# Patient Record
Sex: Female | Born: 1945 | Race: White | Hispanic: No | Marital: Single
Health system: Midwestern US, Academic
[De-identification: ages and names within clinical notes are randomized; demographics above are authoritative.]

---

## 2016-07-08 MED ORDER — METOPROLOL SUCCINATE 50 MG PO TB24
75 mg | ORAL_TABLET | Freq: Every day | ORAL | 3 refills | 90.00000 days | Status: DC
Start: 2016-07-08 — End: 2017-11-19

## 2016-10-09 ENCOUNTER — Encounter: Admit: 2016-10-09 | Discharge: 2016-10-09 | Payer: MEDICARE

## 2016-10-09 ENCOUNTER — Ambulatory Visit: Admit: 2016-10-09 | Discharge: 2016-10-10 | Payer: MEDICARE

## 2016-10-09 DIAGNOSIS — Z95810 Presence of automatic (implantable) cardiac defibrillator: ICD-10-CM

## 2016-10-09 DIAGNOSIS — I428 Other cardiomyopathies: ICD-10-CM

## 2016-10-09 DIAGNOSIS — I4581 Long QT syndrome: Secondary | ICD-10-CM

## 2016-10-10 DIAGNOSIS — Z95810 Presence of automatic (implantable) cardiac defibrillator: ICD-10-CM

## 2016-10-10 DIAGNOSIS — I471 Supraventricular tachycardia: ICD-10-CM

## 2016-10-10 DIAGNOSIS — I4901 Ventricular fibrillation: Principal | ICD-10-CM

## 2017-02-16 ENCOUNTER — Encounter: Admit: 2017-02-16 | Discharge: 2017-02-16 | Payer: MEDICARE

## 2017-02-16 ENCOUNTER — Ambulatory Visit: Admit: 2017-02-16 | Discharge: 2017-02-17 | Payer: MEDICARE

## 2017-02-17 DIAGNOSIS — I471 Supraventricular tachycardia: ICD-10-CM

## 2017-02-17 DIAGNOSIS — I4581 Long QT syndrome: ICD-10-CM

## 2017-02-17 DIAGNOSIS — Z95810 Presence of automatic (implantable) cardiac defibrillator: ICD-10-CM

## 2017-02-17 DIAGNOSIS — I4901 Ventricular fibrillation: Principal | ICD-10-CM

## 2017-04-16 ENCOUNTER — Ambulatory Visit: Admit: 2017-04-16 | Discharge: 2017-04-17 | Payer: MEDICARE

## 2017-04-16 DIAGNOSIS — Z95810 Presence of automatic (implantable) cardiac defibrillator: Secondary | ICD-10-CM

## 2017-04-17 DIAGNOSIS — I471 Supraventricular tachycardia: Principal | ICD-10-CM

## 2017-04-17 DIAGNOSIS — I4581 Long QT syndrome: ICD-10-CM

## 2017-07-15 ENCOUNTER — Encounter: Admit: 2017-07-15 | Discharge: 2017-07-15 | Payer: MEDICARE

## 2017-07-16 ENCOUNTER — Ambulatory Visit: Admit: 2017-07-16 | Discharge: 2017-07-17 | Payer: MEDICARE

## 2017-07-17 DIAGNOSIS — I4581 Long QT syndrome: ICD-10-CM

## 2017-07-17 DIAGNOSIS — I4901 Ventricular fibrillation: Principal | ICD-10-CM

## 2017-07-17 DIAGNOSIS — Z95810 Presence of automatic (implantable) cardiac defibrillator: ICD-10-CM

## 2017-07-17 DIAGNOSIS — I471 Supraventricular tachycardia: ICD-10-CM

## 2017-07-20 ENCOUNTER — Encounter: Admit: 2017-07-20 | Discharge: 2017-07-20 | Payer: MEDICARE

## 2017-10-15 ENCOUNTER — Ambulatory Visit: Admit: 2017-10-15 | Discharge: 2017-10-16 | Payer: MEDICARE

## 2017-10-15 DIAGNOSIS — I471 Supraventricular tachycardia: Secondary | ICD-10-CM

## 2017-10-16 DIAGNOSIS — Z95810 Presence of automatic (implantable) cardiac defibrillator: ICD-10-CM

## 2017-10-16 DIAGNOSIS — I4581 Long QT syndrome: ICD-10-CM

## 2017-10-16 DIAGNOSIS — I4901 Ventricular fibrillation: Principal | ICD-10-CM

## 2017-11-19 ENCOUNTER — Ambulatory Visit: Admit: 2017-11-19 | Discharge: 2017-11-19 | Payer: MEDICARE

## 2017-11-19 ENCOUNTER — Encounter: Admit: 2017-11-19 | Discharge: 2017-11-19 | Payer: MEDICARE

## 2017-11-19 DIAGNOSIS — I341 Nonrheumatic mitral (valve) prolapse: ICD-10-CM

## 2017-11-19 DIAGNOSIS — Z95810 Presence of automatic (implantable) cardiac defibrillator: ICD-10-CM

## 2017-11-19 DIAGNOSIS — I4891 Unspecified atrial fibrillation: ICD-10-CM

## 2017-11-19 DIAGNOSIS — I480 Paroxysmal atrial fibrillation: ICD-10-CM

## 2017-11-19 DIAGNOSIS — I4581 Long QT syndrome: ICD-10-CM

## 2017-11-19 DIAGNOSIS — R030 Elevated blood-pressure reading, without diagnosis of hypertension: ICD-10-CM

## 2017-11-19 DIAGNOSIS — I469 Cardiac arrest, cause unspecified: ICD-10-CM

## 2017-11-19 DIAGNOSIS — I340 Nonrheumatic mitral (valve) insufficiency: ICD-10-CM

## 2017-11-19 DIAGNOSIS — I4901 Ventricular fibrillation: Principal | ICD-10-CM

## 2017-11-19 DIAGNOSIS — I428 Other cardiomyopathies: ICD-10-CM

## 2017-11-19 MED ORDER — METOPROLOL SUCCINATE 50 MG PO TB24
75 mg | ORAL_TABLET | Freq: Every day | ORAL | 3 refills | Status: SS
Start: 2017-11-19 — End: 2018-08-31

## 2017-11-21 ENCOUNTER — Encounter: Admit: 2017-11-21 | Discharge: 2017-11-21 | Payer: MEDICARE

## 2017-11-21 DIAGNOSIS — I469 Cardiac arrest, cause unspecified: ICD-10-CM

## 2017-11-21 DIAGNOSIS — I340 Nonrheumatic mitral (valve) insufficiency: ICD-10-CM

## 2017-11-21 DIAGNOSIS — I4891 Unspecified atrial fibrillation: ICD-10-CM

## 2017-11-21 DIAGNOSIS — I341 Nonrheumatic mitral (valve) prolapse: ICD-10-CM

## 2017-12-07 ENCOUNTER — Encounter: Admit: 2017-12-07 | Discharge: 2017-12-07 | Payer: MEDICARE

## 2018-01-14 ENCOUNTER — Ambulatory Visit: Admit: 2018-01-14 | Discharge: 2018-01-15 | Payer: MEDICARE

## 2018-01-14 DIAGNOSIS — Z95810 Presence of automatic (implantable) cardiac defibrillator: Secondary | ICD-10-CM

## 2018-01-15 ENCOUNTER — Encounter: Admit: 2018-01-15 | Discharge: 2018-01-15 | Payer: MEDICARE

## 2018-01-15 DIAGNOSIS — I428 Other cardiomyopathies: Principal | ICD-10-CM

## 2018-01-15 DIAGNOSIS — Z95810 Presence of automatic (implantable) cardiac defibrillator: ICD-10-CM

## 2018-01-15 DIAGNOSIS — I472 Ventricular tachycardia: ICD-10-CM

## 2018-01-15 DIAGNOSIS — I480 Paroxysmal atrial fibrillation: ICD-10-CM

## 2018-04-15 ENCOUNTER — Ambulatory Visit: Admit: 2018-04-15 | Discharge: 2018-04-15 | Payer: MEDICARE

## 2018-04-15 DIAGNOSIS — I428 Other cardiomyopathies: Secondary | ICD-10-CM

## 2018-04-15 DIAGNOSIS — I480 Paroxysmal atrial fibrillation: Principal | ICD-10-CM

## 2018-04-15 DIAGNOSIS — Z95810 Presence of automatic (implantable) cardiac defibrillator: ICD-10-CM

## 2018-07-15 ENCOUNTER — Ambulatory Visit: Admit: 2018-07-15 | Discharge: 2018-07-15

## 2018-07-15 DIAGNOSIS — I428 Other cardiomyopathies: Secondary | ICD-10-CM

## 2018-07-15 DIAGNOSIS — Z95810 Presence of automatic (implantable) cardiac defibrillator: Secondary | ICD-10-CM

## 2018-07-15 DIAGNOSIS — I480 Paroxysmal atrial fibrillation: Secondary | ICD-10-CM

## 2018-07-16 ENCOUNTER — Encounter: Admit: 2018-07-16 | Discharge: 2018-07-16

## 2018-07-24 IMAGING — MG MAMMOGRAM 3D SCREEN, BILATERAL
12 of 17 series · 12 of 17 positions shown · non-contrast
Comparison: none

[R CC (1 of 2)]
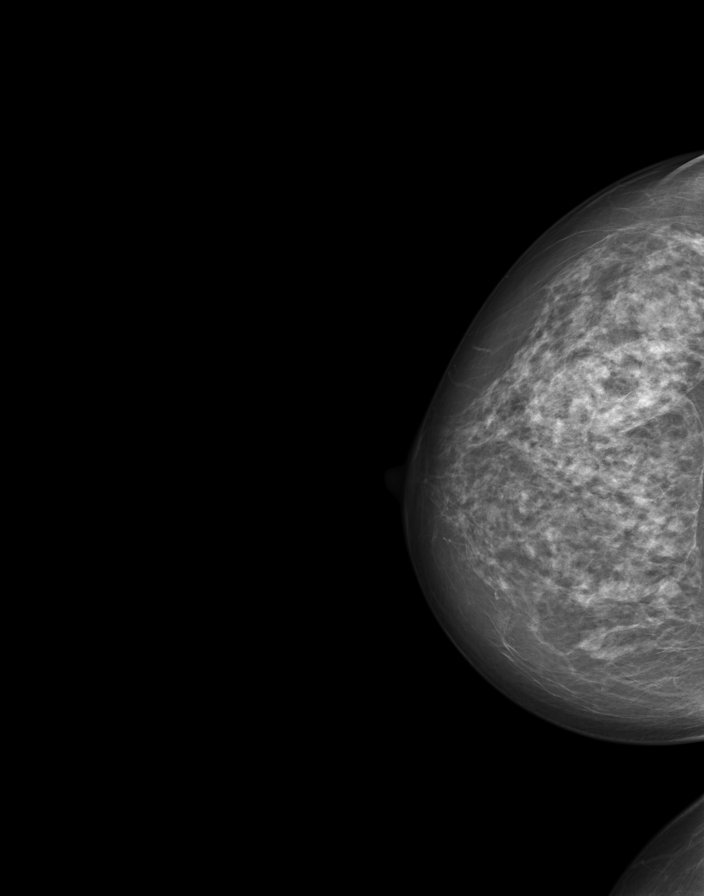

[R tomo (1 of 2)]
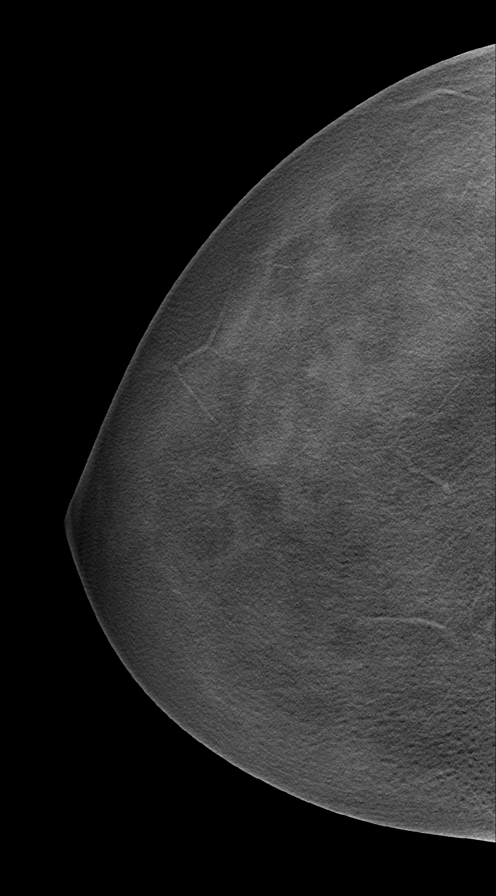

[R CC (2 of 2)]
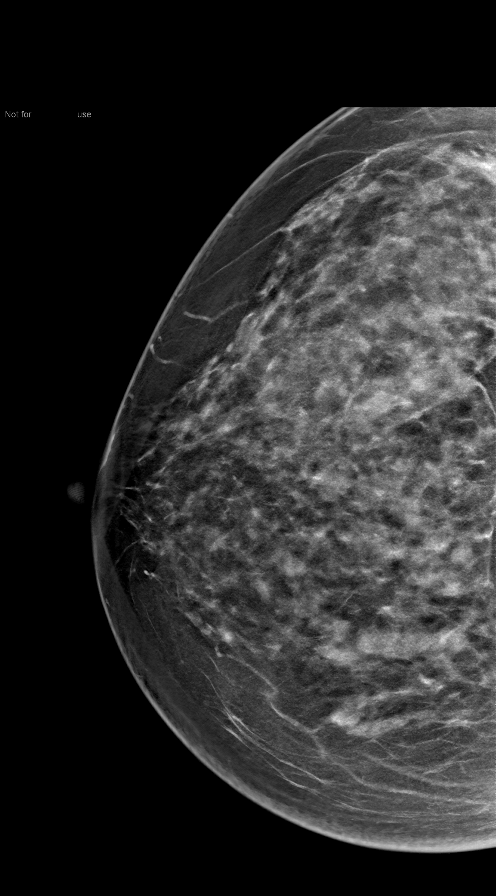

[R (1 of 2)]
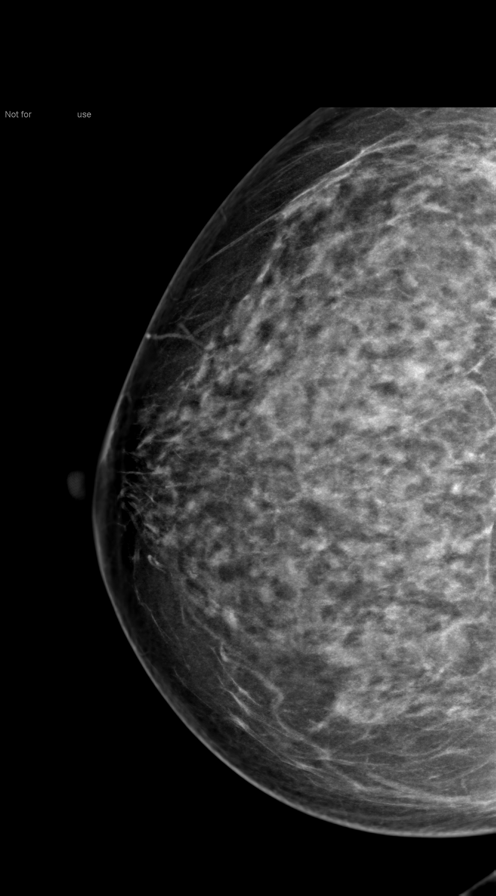

[L CC (1 of 2)]
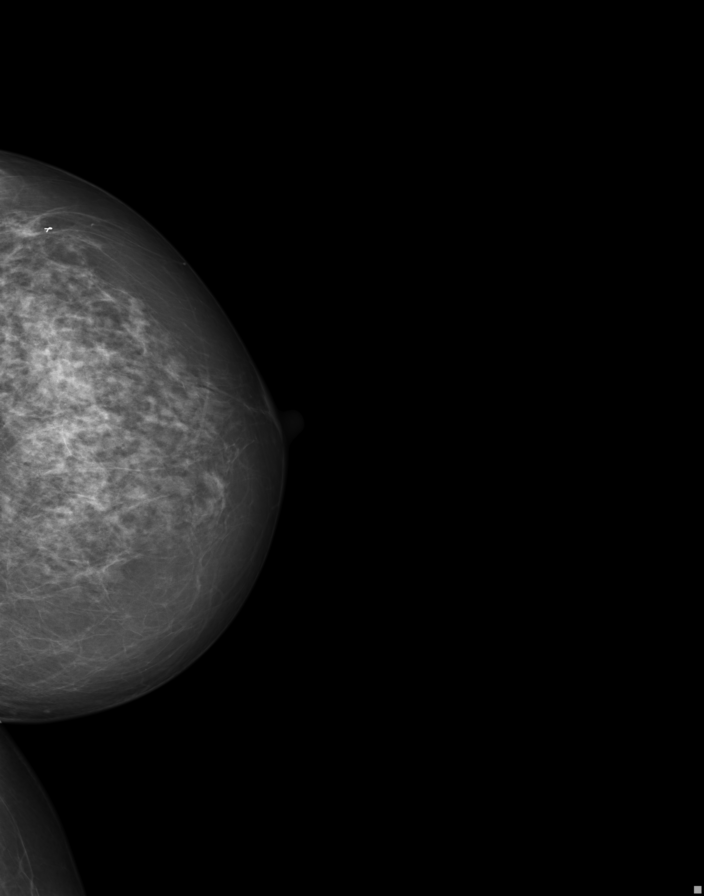

[L tomo]
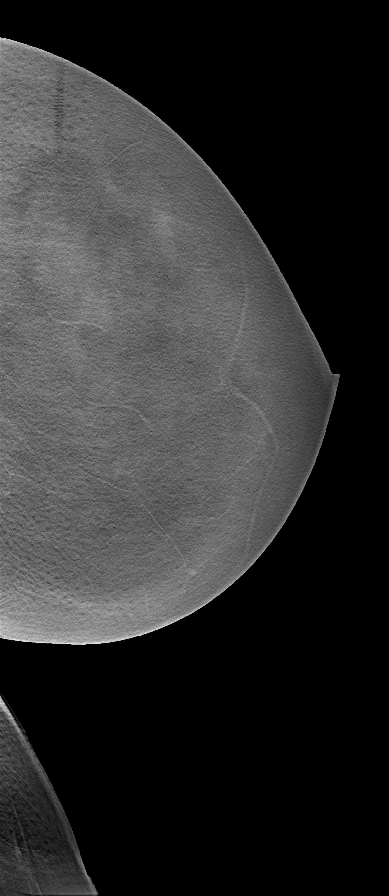

[L CC (2 of 2)]
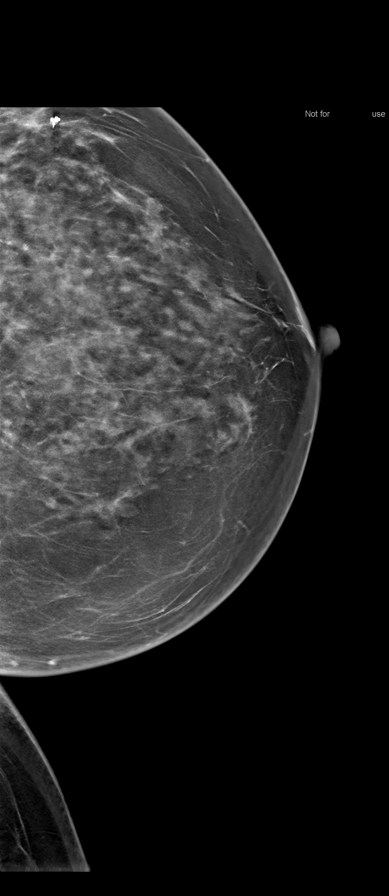

[L]
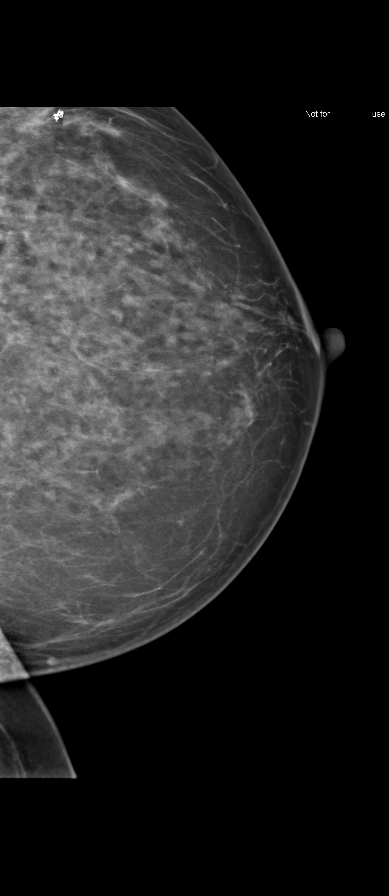

[R MLO (1 of 2)]
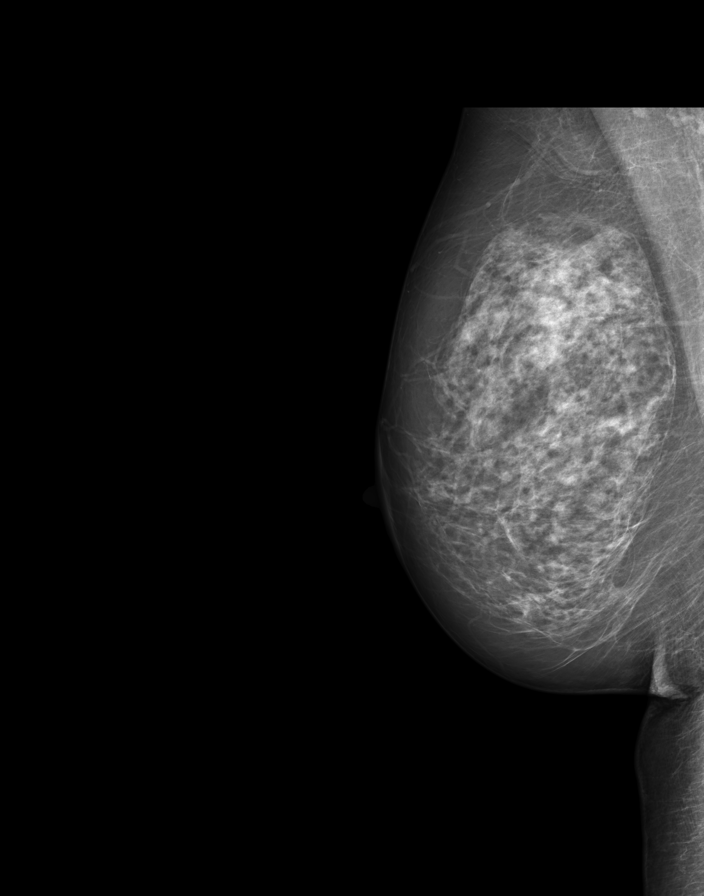

[R tomo (2 of 2)]
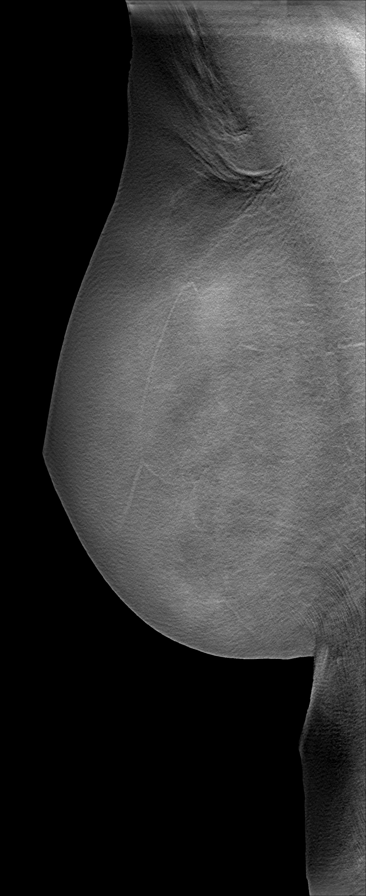

[R MLO (2 of 2)]
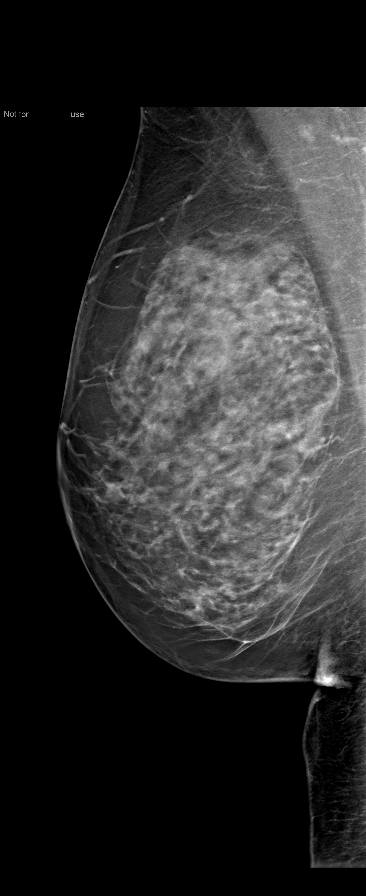

[R (2 of 2)]
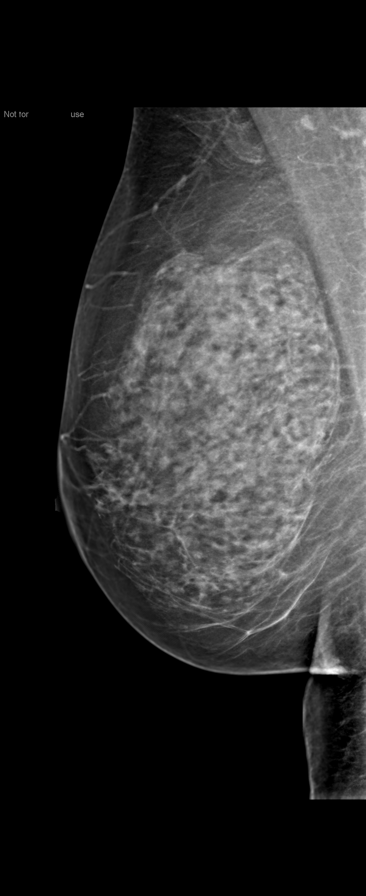

[12 of 17 positions shown; findings below may reference images not displayed]

EXAM

Digital screening mammogram with 3D breast tomography.

INDICATION

SCREENING
HX 2 BENIGN BXs - RT BREAST IN 9444, LT BREAST IN 1088. PACEMAKER.
NULLIPARITY. MOTHER DX @ 85. SCREENING. AB (3D) PRIORS: 4486, 7401.

FINDINGS

The prior study was reviewed from 07/12/2014.

Digital 2D CC and MLO projections obtained with 3D tomographic views per manufacturer's protocol.
ICAD version 7.2 was used during this exam.

There are scattered areas of fibroglandular density bilaterally. There are no mass lesions or
suspicious calcifications.

There is a marker clip from a previous left breast biopsy.

Pacemaker overlies the left axilla.

There has been no significant change from the previous examination.

IMPRESSION

Benign mammogram with 3D breast tomography. BI-RADS 2. Screening mammography in 12 months is
recommended. A followup letter will be scheduled.

## 2018-08-29 ENCOUNTER — Encounter: Admit: 2018-08-29 | Discharge: 2018-08-29

## 2018-08-29 DIAGNOSIS — R7989 Other specified abnormal findings of blood chemistry: Secondary | ICD-10-CM

## 2018-08-29 DIAGNOSIS — I480 Paroxysmal atrial fibrillation: Secondary | ICD-10-CM

## 2018-08-29 DIAGNOSIS — I469 Cardiac arrest, cause unspecified: Secondary | ICD-10-CM

## 2018-08-29 DIAGNOSIS — I4891 Unspecified atrial fibrillation: Secondary | ICD-10-CM

## 2018-08-29 DIAGNOSIS — I341 Nonrheumatic mitral (valve) prolapse: Secondary | ICD-10-CM

## 2018-08-29 DIAGNOSIS — I340 Nonrheumatic mitral (valve) insufficiency: Secondary | ICD-10-CM

## 2018-08-29 DIAGNOSIS — Z4502 Encounter for adjustment and management of automatic implantable cardiac defibrillator: Secondary | ICD-10-CM

## 2018-08-29 LAB — LIPID PROFILE
Lab: 145 mg/dL — ABNORMAL HIGH (ref <100)
Lab: 170 mg/dL
Lab: 175 mg/dL — ABNORMAL HIGH (ref <150)
Lab: 217 mg/dL — ABNORMAL HIGH (ref <200)
Lab: 47 mg/dL (ref >40)

## 2018-08-29 LAB — URINALYSIS DIPSTICK REFLEX TO CULTURE
Lab: 1 mg/dL (ref 1.003–1.035)
Lab: NEGATIVE % (ref 41–77)
Lab: NEGATIVE U/L (ref 7–40)
Lab: NEGATIVE U/L — ABNORMAL LOW (ref 25–110)

## 2018-08-29 LAB — TROPONIN-I
Lab: 0.4 ng/mL — ABNORMAL HIGH (ref 0.0–0.05)
Lab: 0.2 ng/mL — ABNORMAL HIGH (ref 0.0–0.05)

## 2018-08-29 LAB — URINALYSIS MICROSCOPIC REFLEX TO CULTURE

## 2018-08-29 LAB — CBC AND DIFF
Lab: 0 10*3/uL (ref 0–0.20)
Lab: 0.3 10*3/uL (ref 0–0.45)
Lab: 1.5 K/UL — ABNORMAL LOW (ref 1.0–4.8)
Lab: 4.9 M/UL (ref 4.0–5.0)
Lab: 88 FL — ABNORMAL HIGH (ref 80–100)
Lab: 9.1 10*3/uL (ref 4.5–11.0)

## 2018-08-29 LAB — COMPREHENSIVE METABOLIC PANEL
Lab: 0.3 mg/dL (ref 0.3–1.2)
Lab: 107 MMOL/L (ref 98–110)
Lab: 12 K/UL (ref 3–12)
Lab: >60 mL/min (ref >60)

## 2018-08-29 LAB — COVID-19 (SARS-COV-2) PCR

## 2018-08-29 LAB — PTT (APTT): Lab: 32 s (ref 24.0–36.5)

## 2018-08-29 LAB — BNP POC ER: Lab: 274 pg/mL — ABNORMAL HIGH (ref 0–100)

## 2018-08-29 LAB — POC TROPONIN: Lab: 0.1 ng/mL — ABNORMAL HIGH (ref 0.00–0.05)

## 2018-08-29 MED ORDER — SOTALOL 80 MG PO TAB
120 mg | ORAL | 0 refills | Status: DC
Start: 2018-08-29 — End: 2018-08-31
  Administered 2018-08-29 – 2018-08-31 (×6): 120 mg via ORAL

## 2018-08-29 MED ORDER — ASPIRIN 81 MG PO TBEC
81 mg | Freq: Every day | ORAL | 0 refills | Status: DC
Start: 2018-08-29 — End: 2018-08-31
  Administered 2018-08-29 – 2018-08-31 (×3): 81 mg via ORAL

## 2018-08-29 MED ORDER — METOPROLOL SUCCINATE 50 MG PO TB24
100 mg | Freq: Every day | ORAL | 0 refills | Status: DC
Start: 2018-08-29 — End: 2018-08-31
  Administered 2018-08-30 – 2018-08-31 (×2): 100 mg via ORAL

## 2018-08-29 MED ORDER — POTASSIUM CHLORIDE 20 MEQ/15 ML PO LIQD
20-40 meq | ORAL | 0 refills | Status: AC | PRN
Start: 2018-08-29 — End: ?

## 2018-08-29 MED ORDER — POTASSIUM CHLORIDE 20 MEQ PO TBTQ
20-40 meq | ORAL | 0 refills | Status: AC | PRN
Start: 2018-08-29 — End: ?

## 2018-08-29 MED ORDER — APIXABAN 5 MG PO TAB
5 mg | Freq: Two times a day (BID) | ORAL | 0 refills | Status: DC
Start: 2018-08-29 — End: 2018-08-31
  Administered 2018-08-30 – 2018-08-31 (×4): 5 mg via ORAL

## 2018-08-29 MED ORDER — ENOXAPARIN 40 MG/0.4 ML SC SYRG
40 mg | Freq: Every day | SUBCUTANEOUS | 0 refills | Status: DC
Start: 2018-08-29 — End: 2018-08-29
  Administered 2018-08-29: 15:00:00 40 mg via SUBCUTANEOUS

## 2018-08-29 MED ORDER — ASPIRIN 81 MG PO CHEW
243 mg | Freq: Once | ORAL | 0 refills | Status: CP
Start: 2018-08-29 — End: ?
  Administered 2018-08-29: 09:00:00 243 mg via ORAL

## 2018-08-29 MED ORDER — IMS MIXTURE TEMPLATE
75 mg | Freq: Every day | ORAL | 0 refills | Status: DC
Start: 2018-08-29 — End: 2018-08-29
  Administered 2018-08-29 (×2): 75 mg via ORAL

## 2018-08-29 MED ORDER — MULTIVITAMIN PO TAB
1 | Freq: Every day | ORAL | 0 refills | Status: DC
Start: 2018-08-29 — End: 2018-08-31
  Administered 2018-08-29 – 2018-08-31 (×3): 1 via ORAL

## 2018-08-29 MED ORDER — MAGNESIUM SULFATE IN D5W 1 GRAM/100 ML IV PGBK
1 g | INTRAVENOUS | 0 refills | Status: AC | PRN
Start: 2018-08-29 — End: ?

## 2018-08-29 MED ORDER — CETIRIZINE 10 MG PO TAB
10 mg | Freq: Every day | ORAL | 0 refills | Status: DC
Start: 2018-08-29 — End: 2018-08-31
  Administered 2018-08-29 – 2018-08-31 (×3): 10 mg via ORAL

## 2018-08-29 NOTE — H&P (View-Only)
Adult H&P    [facility]  Admission Date:  08/29/2018                     Assessment/Plan:    Principal Problem:    ICD (implantable cardioverter-defibrillator) discharge  Active Problems:    Implantable cardioverter-defibrillator (ICD) discharge    Tammy Heath is a 73 y.o. female  With past medical history of ventricular fibrillation cardiac arrest (08/2005), s/p ICD. 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 ICD interrogation shows atrial fibrillation with RVR with 6 shocks administered.     # Atrial fibrillation with rapid ventricular rate, currently in sinus rhythm  # s/p ICD shocks  - Will continue Metoprolol XL 75 mg dialy  - Patient CHADS VASC score is 4. However, she is not on anticoagulation given her prior bleeding history. Patient reports she does not want anticoagulation.   - EP consultation for ICD reprogramming and possibility of initiating rhythm control medications    # Non ischemic cardiomyopathy  # Fatigue, dyspnea on exertion  # Hyperlipidemia  - Will get an ECHO today  - She had been planned for a nuclear stress test as an outpatient but has not been performed. Patient does report fatigue and shotness of breath on exertion. However, she also reports that she is able to walk more than 4 blocks without exertion. Stress test can be performed as an outpatient.   - Check lipid profile and consider starting statin    # s/p mitral valve replacement    Supportive:  - DVT prophylaxis: Lovenox  - Diet: cardiac diet    Will discuss with Dr. Raynelle Bring.    Lily Lovings  PGY-5 Fellow, Cardiovascular disease  Pager: [phone]    __________________________________________________________________________________    Chief Complaint:  ICD shocks  History of Present Illness: Tammy Heath is a 73 y.o. female  With past medical history of ventricular fibrillation cardiac arrest (08/2005), s/p ICD. 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 Patient reports that for the past 3-4 days, she has had episodes of palpitation which last for a very short time, resolves spontaneoulsy. She had an episode last night before she went to bed. She woke up at 11:30 am and went to the restroom where she had a ICD shock. She subsequently had another shock and she was brought to the hospital. Patient reports that she had 4 more shock, making it 6 in total.    She does not have chest pain on exertion, palpitation, lower extremity swelling, orthopnea, PND. She gets short of breath on climbing about 2 flight of stairs, no recent worsening. No fever, chills, headache, sore throat. No sick contacts, recent travel.     Patient follows up with Dr. Bradly Bienenstock as an outpatient for her atrial fibrillation. She is not on any anticoagulation as she reports not tolerating anticoagulation in the past with development of hemopericardium requiring drainage. She has been on aspirin. She had last visited Dr. Bradly Bienenstock on 11/19/2017 when she had revealed that she was having worsening fatigue and dyspnea on exertion. Nuclear stress test and echocardiogram was planned but has not yet been performed.     Medical History:   Diagnosis Date   ??? Atrial fibrillation (HCC)    ??? Cardiac arrest (HCC)    ??? Cardiac arrest - ventricular fibrillation  8/07    S/p dual chamber defibrillator   ??? Mitral regurgitation 08/04/2008   ??? Mitral valve prolapse 08/04/2008     Surgical History:   Procedure Laterality Date   ??? CARDIAC DEFIBRILLATOR PLACEMENT  8/07     Family History   Problem Relation Age of Onset   ??? Heart Failure Mother    ??? Other Father         COPD   ??? Other Sister         Cardiac valve repair, 3 other sister healthy   ??? Other Brother 2 healthy brothers     Social History     Socioeconomic History   ??? Marital status: Single     Spouse name: Not on file   ??? Number of children: Not on file   ??? Years of education: Not on file   ??? Highest education level: Not on file   Occupational History   ??? 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 Smoking status: Never Smoker   ??? Smokeless tobacco: Never Used   Substance and Sexual Activity   ??? Alcohol use: No   ??? Drug use: No   ??? Sexual activity: Not on file   Other Topics Concern   ??? Not on file   Social History Narrative   ??? Not on file        Allergies:  Adhesive tape-silicones    Medications:  Scheduled Meds:Continuous Infusions:  PRN and Respiratory Meds:       Review of Systems:  All other systems reviewed and are negative.    Physical Exam:  Vital Signs: Last Filed In 24 Hours Vital Signs: 24 Hour Range   BP: 138/78 (07/19 0500)  Temp: 36.9 ???C (98.4 ???F) (07/19 0128)  Pulse: 67 (07/19 0500)  Respirations: 12 PER MINUTE (07/19 0500)  SpO2: 98 % (07/19 0500)  SpO2 Pulse: 67 (07/19 0500) BP: (119-177)/(70-83)   Temp:  [36.9 ???C (98.4 ???F)]   Pulse:  [60-72]   Respirations:  [12 PER MINUTE-24 PER MINUTE]   SpO2:  [98 %-100 %]      No intake or output data in the 24 hours ending 08/29/18 0518     Physical Exam   GENERAL: The patient is well developed, well nourished, resting comfortably and in no distress.   HEENT: No abnormalities of the visible oro-nasopharynx, conjunctiva or sclera are noted.  NECK: There is no jugular venous distension. Carotids are palpable and without bruits. There is no thyroid enlargement.  Chest: Lung fields are clear to auscultation. There are no wheezes or crackles.  CV: There is a regular rhythm. The first and second heart sounds are normal. There are no murmurs, gallops or rubs.  ABD: The abdomen is soft and supple with normal bowel sounds. There is no hepatosplenomegaly, ascites, tenderness, masses or bruits. Neuro: There are no focal motor defects.   Ext: There is no edema or evidence of deep vein thrombosis. Peripheral pulses are satisfactory.    SKIN: There are no rashes and no cellulitis  PSYCH: The patient is calm, rationale and oriented.    Lab/Radiology/Other Diagnostic Tests:  CBC w/Diff    Lab Results   Component Value Date/Time    WBC 9.1 08/29/2018 02:02 AM    RBC 4.97 08/29/2018 02:02 AM    HGB 14.9 08/29/2018 02:02 AM    HCT 43.9 08/29/2018 02:02 AM    MCV 88.4  08/29/2018 02:02 AM    MCH 30.1 08/29/2018 02:02 AM    MCHC 34.0 08/29/2018 02:02 AM    RDW 13.9 08/29/2018 02:02 AM    PLTCT 236 08/29/2018 02:02 AM    MPV 9.2 08/29/2018 02:02 AM    Lab Results   Component Value Date/Time    NEUT 71 08/29/2018 02:02 AM    ANC 6.53 08/29/2018 02:02 AM    LYMA 17 (L) 08/29/2018 02:02 AM    ALC 1.55 08/29/2018 02:02 AM    MONA 8 08/29/2018 02:02 AM    AMC 0.69 08/29/2018 02:02 AM    EOSA 3 08/29/2018 02:02 AM    AEC 0.30 08/29/2018 02:02 AM    BASA 1 08/29/2018 02:02 AM    ABC 0.06 08/29/2018 02:02 AM       Comprehensive Metabolic Profile    Lab Results   Component Value Date/Time    NA 142 08/29/2018 02:02 AM    K 4.1 08/29/2018 02:02 AM    CL 107 08/29/2018 02:02 AM    CO2 23 08/29/2018 02:02 AM    GAP 12 08/29/2018 02:02 AM    BUN 20 08/29/2018 02:02 AM    CR 0.96 08/29/2018 02:02 AM    GLU 134 (H) 08/29/2018 02:02 AM    Lab Results   Component Value Date/Time    CA 9.7 08/29/2018 02:02 AM    PO4 2.2 08/23/2008 04:21 AM    ALBUMIN 4.0 08/29/2018 02:02 AM    TOTPROT 7.0 08/29/2018 02:02 AM    ALKPHOS 72 08/29/2018 02:02 AM    AST 21 08/29/2018 02:02 AM    ALT 14 08/29/2018 02:02 AM    TOTBILI 0.3 08/29/2018 02:02 AM    GFR 57 (L) 08/29/2018 02:02 AM    GFRAA >60 08/29/2018 02:02 AM       Thyroid Studies    Lab Results   Component Value Date/Time    TSH 2.26 08/29/2018 02:02 AM    Lab Results   Component Value Date/Time    TNI 0.49 (H) 08/23/2008 04:21 AM ECG: Normal sinus rhythm at 80 beats per minute, left atrial enlargement, left axis deviation, no acute ST T wave changes.     Echocardiogram:  07/25/2015  The LV ejection fraction appears to be in the 55% range.  Both atria appear to be mildly enlarged.  Pacemaker wire is noted in the RA and RV.  The mitral valve has been repaired with triangular resection of P2 and implantation of a 27 mm posterior annuloplasty band.  Only trivial MR is noted at this time.  Moderate tricuspid regurgitation is present.  The estimated PA systolic pressure is within normal limits.  Moderate PI is present.    ICD interrogation:  Patient had 15 episodes of atrial tachycardia in the VT/VF zone. There were 2 episodes when the patient received ATP. The first episode patient received 2 41 J shock. After the shock, the atrial fibrillation with RVR appears to slow down in rate. The second episode, patient received 4 41J shock. After these shocks, patient was noted to convert to sinus rhythm.     Chest X-Ray:Stable chest. No acute cardiopulmonary abnormality.     Lily Lovings, MBBS

## 2018-08-29 NOTE — Consults
Electrophysiology Consult Note:    Admission Date: 08/29/2018  Date of Consultation:  08/29/2018  LOS: 0 days  Requesting Physician: Glynda Jaeger, MD  Consulting Physician:  Dr. Leeann Must   Code Status: Full Code    Reason for Consultation  Opinion and recommendations regarding AF RVR and ICD shocks    Assessment:  Principal Problem:    ICD (implantable cardioverter-defibrillator) discharge  Active Problems:    Implantable cardioverter-defibrillator (ICD) discharge    Follows with Dr. Bradly Bienenstock for EP     Cardiac Arrest 2007 s/p ICD  - dual chamber [employer] DDD-ICD implanted 2007  - device interrogation 6/2 shows NSVT episodes, but EGMs suggest AT with 1:1 conduction, no AF  - device interrogation 7/19 shows AF RVR with rates 180s with 6 shocks delivered  - PTA Toprol XL 75mg  daily     Paroxysmal atrial fibrillation with RVR  Paroxysmal atrial tachycardia  - CHADSVASC = 4  - prefers not to be on OAC d/t hx of bleeding    NICM, echo pending  - last echo 2017 EF 55%  - c/o nonspecific DOE   - was previously scheduled for outpt stress    Mitral Valve Repair 2010  - complicated by pericardial effusion     Recommendations:  Patient with evidence of what appears to be atrial fibrillation with rapid ventricular response on device interrogation.  She follows with Dr. Bradly Bienenstock for EP.  She has a known history of paroxysmal atrial tachycardia/atrial fibrillation.  Her episodes have all been of very short duration (seconds).  Patient has been resistant to starting oral anticoagulation in the past due to her history of pericardial effusion post mitral valve repair.    We discussed several strategies for addressing her arrhythmias today including ablation as well as medication.  After conversation, she is agreeable to starting sotalol at 120 mg twice daily and remaining inpatient for sotalol initiation for at least 5 doses.  She is also agreeable to starting anticoagulation for prevention of stroke related to atrial fibrillation.      Creatinine clearance 60 mL/min, baseline QTC 433 ms.  Labs reviewed and electrolytes stable.    We made some changes to her device programming to prevent future inappropriate shocks.  See report for full details.    We will also increase her Toprol-XL to 100 mg daily for additional rate control should she have recurrence of A. fib.    Start OAC with Eliquis 5mg  BID starting tonight (she received subcutaneous heparin this morning)  Start sotalol 120 mg twice daily.  She will need to be moved to a nursing unit with sotalol trained staff.  Increase Toprol-XL to 100 mg daily    We will monitor ECG 2 hours after each dose of Sotalol x 5 total doses.  Aim to keep Mg+ > 2.0 and K+ > 4.0 (will replace as needed).  Continuous telemetry.  Please avoid all QT prolonging medications.  Will follow along with you.    If ECG needs to be reviewed after hours, please page: [facility] - Night Float thru [employer].  Please only call if next dose of antiarrhythmic is due, otherwise ECG will be reviewed in the morning.        Seth Bake, APRN-C (personal pgr [phone])  Heart Rhythm Management (team pgr [phone])      Medicare attestation  ________________________________________________________________  History of Present Illness:  Tammy Heath is a 73 y.o. female patient with past medical history of ventricular fibrillation cardiac  arrest (08/2005), s/p ICD. 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 ICD interrogation shows atrial fibrillation with RVR with 6 shocks administered.         Past Medical History:  Medical History:   Diagnosis Date   ??? Atrial fibrillation (HCC)    ??? Cardiac arrest (HCC)    ??? Cardiac arrest - ventricular fibrillation 8/07    S/p dual chamber defibrillator   ??? Mitral regurgitation 08/04/2008   ??? Mitral valve prolapse 08/04/2008         Social History: Social History     Socioeconomic History   ??? Marital status: Single     Spouse name: Not on file   ??? Number of children: Not on file   ??? Years of education: Not on file   ??? Highest education level: Not on file   Occupational History   ??? 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 Smoking status: Never Smoker   ??? Smokeless tobacco: Never Used   Substance and Sexual Activity   ??? Alcohol use: No   ??? Drug use: No   ??? Sexual activity: Not on file   Other Topics Concern   ??? Not on file   Social History Narrative   ??? Not on file       Surgical History:  Surgical History:   Procedure Laterality Date   ??? CARDIAC DEFIBRILLATOR PLACEMENT  8/07       Family History:  Family History   Problem Relation Age of Onset   ??? Heart Failure Mother    ??? Other Father         COPD   ??? Other Sister         Cardiac valve repair, 3 other sister healthy   ??? Other Brother         2 healthy brothers       Medications:  aspirin EC tablet 81 mg, 81 mg, Oral, QDAY  cetirizine (ZyrTEC) tablet 10 mg, 10 mg, Oral, QDAY  enoxaparin (LOVENOX) syringe 40 mg, 40 mg, Subcutaneous, QDAY(21)  metoprolol XL (TOPROL XL) tablet 75 mg, 75 mg, Oral, QDAY  vitamins, multiple tablet 1 tablet, 1 tablet, Oral, QDAY          Allergies:  Allergies   Allergen Reactions   ??? Adhesive Tape-Silicones RASH     Use paper tape       Review of Systems:  Const: denies recent fever, chills, or weight loss  Eyes:  denies changes in vision  Ears/Nose/Throat:  denies congestion or rhinorrhea  Cardiovascular:  denies chest pain or palpitations  Respiratory: denies dyspnea, orthopnea, PND or productive cough    Gastrointestinal:  denies N/V/D  Genitourinary:  denies dysuria or hematuria  Musculoskeletal:  denies muscle aches or pains  Skin: denies known rashes, lesions or sores  Neurologic:  denies dizziness, lightheadedness, syncope, or near-syncope  Endocrine:  denies polyuria or polydypsia Heme: denies recent melena or hematochezia                           Vital Signs: Most Recent                 Vital Signs: 24 Hour Range   BP: 138/78 (07/19 0900)  Temp: 36.7 ???C (98 ???F) (07/19 0900)  Pulse: 66 (07/19 0900)  Respirations: 18 PER MINUTE (07/19 0900)  SpO2: 100 % (07/19 0900)  SpO2 Pulse: 67 (07/19 0500) BP: (119-177)/(53-83)   Temp:  [36.7 ???C (98 ???F)-37.1 ???C (98.8 ???F)]   Pulse:  [60-72]   Respirations:  [12 PER MINUTE-24 PER MINUTE]   SpO2:  [98 %-100 %]      Vitals:    08/29/18 0128 08/29/18 0520   Weight: 69.9 kg (154 lb) 69.9 kg (154 lb 1.6 oz)       Intake/Output Summary (Last 24 hours) at 08/29/2018 0917  Last data filed at 08/29/2018 0520  Gross per 24 hour   Intake 0 ml   Output ???   Net 0 ml           Physical Exam:  Limited exam performed due to current COVID-19 outbreak  General Appearance: well developed, well nourished, appears stated age, no acute distress   Pulmonary: non-labored breathing  Neurologic Exam: neurological assessment grossly intact        Labs:  Hematology:    Lab Results   Component Value Date    HGB 14.9 08/29/2018    HCT 43.9 08/29/2018    PLTCT 236 08/29/2018    WBC 9.1 08/29/2018    NEUT 71 08/29/2018    ANC 6.53 08/29/2018    LYMPH 1.5 04/07/2012    ALC 1.55 08/29/2018    MONA 8 08/29/2018    AMC 0.69 08/29/2018    ABC 0.06 08/29/2018    BASOPHILS 0.0 04/07/2012    MCV 88.4 08/29/2018    MCHC 34.0 08/29/2018    MPV 9.2 08/29/2018    RDW 13.9 08/29/2018   , Coagulation:    Lab Results   Component Value Date    PTT 32.2 08/29/2018    INR 2.8 09/27/2008    and General Chemistry:    Lab Results   Component Value Date    NA 142 08/29/2018    K 4.1 08/29/2018    CL 107 08/29/2018    GAP 12 08/29/2018    BUN 20 08/29/2018    CR 0.96 08/29/2018    GLU 134 08/29/2018    CA 9.7 08/29/2018    ALBUMIN 4.0 08/29/2018    LACTIC 1.5 08/23/2008    MG 2.0 08/29/2018    TOTBILI 0.3 08/29/2018       ECG:  ECG shows sinus rhythm with normal conduction, heart rate 74 bpm, baseline QTC 430 ms.      Telemetry:  Sinus rhythm with rates in the 70s primarily      Seth Bake, APRN-C [phone])

## 2018-08-29 NOTE — Consults
Patient is admitted to the cardiology service ([facility]). Please refer to the H&P dated 08/29/2018.     Domingo Madeira, MBBS  Fellow, cardiovascular medicine  Pager: [phone]

## 2018-08-29 NOTE — ED Notes
Patient comes to the ER via [facility] for ICD firing. Patient states for the past week she has noted her heart to be racing at different times of the day but it would come and go. Tonight she got up to go to the bathroom and experienced what she thought was indigestion. She then sat on the toilet and felt her heart rate increase. She then was shocked 6 times per her account. She never lost consciousness. She called EMS. She never felt pain, she does not currently have chest pain. 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     BELONGINGS: pajamas

## 2018-08-29 NOTE — Progress Notes
Patient arrived to room # [phone]) via wheelchair accompanied by RN. Patient transferred to the bed without assistance. Bedside safety checks completed. Initial patient assessment completed. Refer to flowsheet for details.    Admission skin assessment completed with: Maddie May, RN    Pressure injury present on arrival?: No    1. Head/Face/Neck: No  2. Trunk/Back: No  3. Upper Extremities: No  4. Lower Extremities: No  5. Pelvic/Coccyx: No  6. Assessed for device associated injury? Yes  7. Malnutrition Screening Tool (Nursing Nutrition Assessment) Completed? Yes    See Doc Flowsheet for additional wound details.     INTERVENTIONS:

## 2018-08-29 NOTE — ED Notes
Patient comes to the ER via [facility] for ICD firing. Patient states for the past week she has noted her heart to be racing at different times of the day but it would come and go. Tonight she got up to go to the bathroom and experienced what she thought was indigestion. She then sat on the toilet and felt her heart rate increase. She then was shocked 6 times per her account. She never lost consciousness. She called EMS. She never felt pain, she does not currently have chest pain. 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     BELONGINGS: pajamas

## 2018-08-29 NOTE — ED Notes
[place] ICD interrogation- complete

## 2018-08-29 NOTE — Progress Notes
Patient arrived to room [id number] via cart accompanied by transport. Patient transferred to the bed without assistance. Bedside safety checks completed. Initial patient assessment completed. Refer to flowsheet for details.    Admission skin assessment completed with: Nira Conn, RN    Pressure injury present on arrival?: No    1. Head/Face/Neck: No  2. Trunk/Back: No  3. Upper Extremities: No  4. Lower Extremities: No  5. Pelvic/Coccyx: No  6. Assessed for device associated injury? Yes  7. Malnutrition Screening Tool (Nursing Nutrition Assessment) Completed? No    See Doc Flowsheet for additional wound details.     INTERVENTIONS:  Not pressure injuries POA, will CTM

## 2018-08-29 NOTE — ED Notes
Dance movement psychotherapist.

## 2018-08-29 NOTE — ED Notes
[employer] scientific report handed to Dr. Edmonia James.

## 2018-08-30 ENCOUNTER — Encounter: Admit: 2018-08-30 | Discharge: 2018-08-30

## 2018-08-30 DIAGNOSIS — I480 Paroxysmal atrial fibrillation: Secondary | ICD-10-CM

## 2018-08-30 DIAGNOSIS — Z7901 Long term (current) use of anticoagulants: Secondary | ICD-10-CM

## 2018-08-30 DIAGNOSIS — I469 Cardiac arrest, cause unspecified: Secondary | ICD-10-CM

## 2018-08-30 DIAGNOSIS — I4891 Unspecified atrial fibrillation: Secondary | ICD-10-CM

## 2018-08-30 DIAGNOSIS — Z8674 Personal history of sudden cardiac arrest: Secondary | ICD-10-CM

## 2018-08-30 DIAGNOSIS — I341 Nonrheumatic mitral (valve) prolapse: Secondary | ICD-10-CM

## 2018-08-30 DIAGNOSIS — I340 Nonrheumatic mitral (valve) insufficiency: Secondary | ICD-10-CM

## 2018-08-30 LAB — CBC AND DIFF
Lab: 14 g/dL — ABNORMAL LOW (ref 12.0–15.0)
Lab: 6.4 K/UL — ABNORMAL LOW (ref 4.5–11.0)

## 2018-08-30 LAB — COMPREHENSIVE METABOLIC PANEL
Lab: 21 mg/dL (ref >60)
Lab: 61 U/L — ABNORMAL LOW (ref 25–110)

## 2018-08-30 LAB — PROTIME INR (PT): Lab: 1.3 FL — ABNORMAL HIGH (ref 0.8–1.2)

## 2018-08-30 MED ORDER — ACETAMINOPHEN 325 MG PO TAB
650 mg | ORAL | 0 refills | Status: DC | PRN
Start: 2018-08-30 — End: 2018-08-31
  Administered 2018-08-30 (×2): 650 mg via ORAL

## 2018-08-30 MED ORDER — ATORVASTATIN 20 MG PO TAB
20 mg | Freq: Every day | ORAL | 0 refills | Status: DC
Start: 2018-08-30 — End: 2018-08-31
  Administered 2018-08-30 – 2018-08-31 (×2): 20 mg via ORAL

## 2018-08-30 NOTE — Case Management (ED)
Case Management Progress Note    NAME:Tammy Heath                          MRN: [id number]              DOB:Oct 22, 1945          AGE: 73 y.o.  ADMISSION DATE: 08/29/2018             DAYS ADMITTED: LOS: 1 day      Today???s Date: 08/30/2018    Plan  NCM called [facility] in [place] to determine benefits for Eliquis and Sotolol (pt unsure of Rx coverage). Per [facility] test claim, both Eliquis and Sotolol will have copays less than $10. No PA required for either med. Will continue to monitor for dc needs.    Interventions  ? Support      ? Info or Referral      ? Discharge Planning      ? Medication Needs   Medication Needs: Co-Pay Check, Medication Prior-Auth     Medication: eliquis  Owner: Angie, NCM  Date Initiated: 08/30/18  Insurance Company: Medicare Part D/[employer] risk surveyor Submitted: Phone  Response: No prior authorization required  Comments: Eliquis does not require a prior auth. [facility]ay at [facility] expected to be $8.95/30 day supply     Medication: sotolol  Owner: Angie, NCM  Date Initiated: 08/30/18  Insurance Company: [employer] D/ [employer]  Route Submitted: Phone  Response: No prior authorization required required  Comments: Sotolol does not require a PA. Copay at [facility] expected to be less than $10  o Financial      ? Legal      ? Other        Disposition  ? Expected Discharge Date    Expected Discharge Date: 09/02/18  Expected Discharge Time: 1300  Discharge Planning Comments: home with family assistance  ? Transportation   Does the patient need discharge transport arranged?: No  Transportation Name, Phone and Availability #1: friend Scientist, research (life sciences) Name, Phone and Availability #2: friend [name]  Does the patient use Medicaid Transportation?: No  ? Next Level of Care (Acute Psych discharges only)      ? Discharge Disposition          Durable Medical Equipment      No service has been selected for the patient.      [place] Destination      No service has been selected for the patient.      [facility] No service has been selected for the patient.      [facility]      No service has been selected for the patient.        Sela Hilding, BSN, RN  Department of Case Management  Pgr: [phone] ph: [phone]

## 2018-08-30 NOTE — Case Management (ED)
Case Management Admission Assessment    NAME:Tammy Heath                          MRN: [id number]             DOB:1946-01-01          AGE: 73 y.o.  ADMISSION DATE: 08/29/2018             DAYS ADMITTED: LOS: 1 day      Today???s Date: 08/30/2018    Source of Information: pt and EMR review    This CM met with pt for assessment on this date via phone r/t covid precautions.  Provided contact information and explanation of SW/NCM roles.  Reviewed [facility], Preparing for Discharge, and Preferred Provider Network hand-outs.  Provided opportunity for questions and discussion. Pt/family encouraged to contact Case Management team with questions and concerns during hospitalization and until patient is able to transition back to the patient's primary care physician.       ??? 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 Pt lives on 3rd floor, elevator present if needed. Sister  Tammy Heath or sister Tammy Heath able to provide transportation to home and 24/7 support upon dc. 6 stairs to enter home, ramp present. Pt was independent of cares prior to admission, to include driving.  ??? Pt owns no DME and has no hx HH/IPR/LTC/HD/LTACH. Pt Heath she has access to all necessary DME at the [facility]  ??? [facility] [place]. Copays are affordable through pt's Medicare D/Silverscripts plan  ??? Pt plans to dc to home with 24/7 family assistance. No additional dc needs identified. Will continue to monitor.    Tammy Heath is a 73 y.o. female  With past medical history of ventricular fibrillation cardiac arrest (08/2005), s/p ICD. 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 ICD interrogation shows atrial fibrillation with RVR with 6 shocks administered.   ???  Plan  Plan: Case Management Assessment, Assist PRN with SW/NCM Services, Discharge Planning for Home Anticipated, No Further Intervention Required  - Will continue Metoprolol XL 75 mg dialy  - Patient CHADS VASC score is 4. However, she is not on anticoagulation given her prior bleeding history. Patient reports she does not want anticoagulation.   - EP consultation for ICD reprogramming and possibility of initiating rhythm control medications  ???  Patient Address/Phone  [address]  [place] [address]  [phone] (home)     Emergency Contact  Extended Emergency Contact Information  Primary Emergency Contact: Tammy Heath  Home Phone: [phone]  Mobile Phone: [phone]  Relation: Friend  Secondary Emergency Contact: Tammy Heath  Home Phone: [phone]  Relation: Friend    Editor, commissioning  Healthcare Directive: Yes, patient has a healthcare directive  Type of Healthcare Directive: Durable power of attorney for healthcare, Living Will  Location of Healthcare Directive: Current and verified in document scanning system      Transportation  Does the patient need discharge transport arranged?: No  Transportation Name, Phone and Availability #1: friend Scientist, research (life sciences) Name, Phone and Availability #2: friend Tammy Heath  Does the patient use Medicaid Transportation?: No    Expected Discharge Date  Expected Discharge Date: 09/02/18  Expected Discharge Time: 1300  Discharge Planning Comments: home with family assistance    Living  Situation Prior to Admission  ? Living Arrangements  Type of Residence: Home, independent  Living Arrangements: Other (Comment)(pt lives in [place])  Financial risk analyst / Tub: Psychologist, counselling  How many levels in the residence?: 3(elevator present if needed)  Can patient live on one level if needed?: No  Does residence have entry and/or side stairs?: Yes(ramp present if needed)  Assistance needed prior to admit or anticipated on discharge: No Who provides assistance or could if needed?: fellow nuns at [facility]  Are they in good health?: Yes  Can support system provide 24/7 care if needed?: Yes  ? Level of Function   Prior level of function: Independent  ? Cognitive Abilities   Cognitive Abilities: Alert and Oriented, Participates in decision making, Recognizes impact of health condition on lifestyle, Understands nature of health condition    Financial Resources  ? Coverage  Primary Insurance: Medicare  Additional Coverage: RX(medicare part D and silver scripts)    ? Source of Income   Source Of Income: Employed  ? Financial Assistance Needed?  n/a    Psychosocial Needs  ? Mental Health  Mental Health History: No  ? Substance Use History  Substance Use History Screen: No  ? Other  n/a    Current/Previous Services  ? PCP  Tammy Heath, [phone], [phone]  ? Pharmacy    [facility] [place], [place] - [address]  [address] [place] [address]  Phone: [phone] Fax: [phone]    [id number] [place] [place] - [address]  [address] [place]  [place] [address]  Phone: [phone] Fax: [phone]    ? Durable Psychologist, educational at home: Grab bars, Nurse, adult, [employer], Single [employer], Government social research officer, Environmental consultant, Biomedical scientist (manual)  ? Home Health  Receiving home health: No  ? Hemodialysis or Peritoneal Dialysis  Undergoing hemodialysis or peritoneal dialysis: No  ? Tube/Enteral Feeds  Receive tube/enteral feeds: No  ? Infusion  Receive infusions: No  ? Private Duty  Private duty help used: No  ? Home and Community Based Services  Home and community based services: No  ? Ryan White  Ryan White: No  ? Hospice  Hospice: No  ? Outpatient Therapy  PT: No  OT: No  SLP: No  ? Skilled Nursing Facility/Nursing Home  SNF: In the past  [facility] Facility: [facility]  Would patient return for future services?: Yes  NH: No  ? Inpatient Rehab  IPR: No  ? Long-Term Acute Care Hospital LTACH: No  ? Acute Hospital Stay  Acute Hospital Stay: No    Sela Hilding, BSN, RN  Department of Case Management  Pgr: [phone] ph: [phone]

## 2018-08-30 NOTE — Progress Notes
General Progress Note    Name:  Tammy Heath   ZOXWR'U Date:  08/30/2018  Admission Date: 08/29/2018  LOS: 1 day                     Assessment and Plan     Principal Problem:    ICD (implantable cardioverter-defibrillator) discharge  Active Problems:    Implantable cardioverter-defibrillator (ICD) discharge    Tammy Heath is a 73 y.o. female  With past medical history of ventricular fibrillation cardiac arrest (08/2005), s/p ICD. 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 ICD interrogation showed atrial fibrillation with RVR with 6 shocks administered.   ???  # Atrial fibrillation with rapid ventricular rate, currently in sinus rhythm  # s/p ICD shocks  - S/p multiple inappropriate shocks for Afib with RVR in 180's  - E/P reprogrammed device on 7/19 for higher threshold for shocks   PLAN  - Sotalol 120mg  q10H for 5 doses with EKG checks after each dose   - Increasing Toprol XL from 75 to 100mg  daily   - Started Eliquis 7/19    ???  # Non ischemic cardiomyopathy  # Fatigue, dyspnea on exertion  # Hyperlipidemia  - Previous Echo 07/2015 with EF 55%  - Lipid panel: LDL 150's, ASCVD risk score of 12.5%  PLAN  - ECHO pending   - She had been planned for a nuclear stress test as an outpatient but has not been performed. Patient does report fatigue and shotness of breath on exertion. However, she also reports that she is able to walk more than 4 blocks without exertion. Stress test can be performed as an outpatient.   - Start atorvastatin 20mg  daily   ???  # s/p mitral valve replacement  ???  Supportive:  - DVT prophylaxis: Lovenox  - Diet: cardiac diet  - Dispo: D/c 7/21     Patient seen and discussed with Dr. Thomas    [place] Quinita Kostelecky, DO  Internal Medicine, PGY-1  Available on Voalte  Page: [phone]      Subjective Tammy Heath reports feeling well this morning, although she did wake up with a frontal headache this morning. Denies weakness, vision changes, changes in speech. She states she has these at home frequently at home as well. She otherwise does not have any chest pain or SOB or leg swelling. She has been eating/voiding/stooling without issue. No further complaints today.     Review of Systems:  Review of Systems   Constitutional: Negative for chills and fever.   Eyes: Negative for blurred vision, double vision and pain.   Respiratory: Negative for cough and shortness of breath.    Cardiovascular: Negative for chest pain, palpitations and orthopnea.   Gastrointestinal: Negative for abdominal pain, diarrhea, nausea and vomiting.   Genitourinary: Negative.    Musculoskeletal: Negative.    Skin: Negative for rash.   Neurological: Positive for headaches.         Objective                            Vital Signs: Last Filed                 Vital Signs: 24 Hour Range   BP: 130/60 (07/20 0442)  Temp: 36.6 ???C (97.8 ???F) (07/20 0442)  Pulse: 60 (  07/20 0442)  Respirations: 16 PER MINUTE (07/20 0442)  SpO2: 94 % (07/20 0442)  Height: 165.1 cm (65) (07/19 1542) BP: (109-147)/(49-78)   Temp:  [36.2 ???C (97.1 ???F)-36.7 ???C (98 ???F)]   Pulse:  [60-70]   Respirations:  [16 PER MINUTE-18 PER MINUTE]   SpO2:  [94 %-100 %]    Intensity Pain Scale (Self Report): 5 (08/30/18 0215) Vitals:    08/29/18 0520 08/29/18 1300 08/29/18 1542   Weight: 69.9 kg (154 lb 1.6 oz) 69.9 kg (154 lb 1.6 oz) 68.8 kg (151 lb 9.6 oz)       Intake/Output Summary:  (Last 24 hours)    Intake/Output Summary (Last 24 hours) at 08/30/2018 0656  Last data filed at 08/30/2018 0400  Gross per 24 hour   Intake 460 ml   Output 800 ml   Net -340 ml           Physical Exam  General:  Alert, pleasant, cooperative, no distress, appears stated age  Head:  Normocephalic, without obvious abnormality, atraumatic  Lungs:  Clear to auscultation bilaterally Heart:   Regular rate and rhythm, S1, S2 normal, no murmur, click rub or gallop  Abdomen:  Soft, non-tender.  Bowel sounds normal.    Extremities: Extremities normal, atraumatic, no cyanosis or edema  Peripheral pulses   2+ and symmetric, all extremities  Skin: Skin color, texture, turgor normal.  No rashes or lesions    Medications     Medications  Scheduled Meds:apixaban (ELIQUIS) tablet 5 mg, 5 mg, Oral, BID  aspirin EC tablet 81 mg, 81 mg, Oral, QDAY  cetirizine (ZyrTEC) tablet 10 mg, 10 mg, Oral, QDAY  metoprolol XL (TOPROL XL) tablet 100 mg, 100 mg, Oral, QDAY  sotaloL (BETAPACE) tablet 120 mg, 120 mg, Oral, Q10H*  vitamins, multiple tablet 1 tablet, 1 tablet, Oral, QDAY    Continuous Infusions:  PRN and Respiratory Meds:acetaminophen Q6H PRN

## 2018-08-30 NOTE — Progress Notes
Electrophysiology Progress Note      Admission Date: 08/29/2018  Today's Date: 08/30/2018  LOS: 1 day      Assessment & Plan   Tammy Heath is a 73 y.o. female patient with the following problems:    Principal Problem:    Atrial fibrillation with RVR (HCC)  Active Problems:    Nonischemic cardiomyopathy (HCC)    Automatic implantable cardioverter-defibrillator in situ    S/P MVR (mitral valve repair)    PAF (paroxysmal atrial fibrillation) (HCC)    Hyperlipidemia    ICD (implantable cardioverter-defibrillator) discharge    Chronic anticoagulationon apixaban    Assessment:  Principal Problem:  ??????ICD (implantable cardioverter-defibrillator) discharge  Active Problems:  ??????Implantable cardioverter-defibrillator (ICD) discharge  ???  Follows with Dr. Bradly Bienenstock for EP  ???   Cardiac Arrest 2007 s/p ICD.  Diagnosed with Long QT syndrome  - dual chamber [employer] DDD-ICD implanted 2007  -   - device interrogation 6/2 shows NSVT episodes, but EGMs suggest AT with 1:1 conduction, no AF  - device interrogation 7/19 shows AF RVR with rates 180s with 6 shocks delivered  - PTA Toprol XL 75mg  daily  - EKG's in [place] reviewed. No obvious QT prolongation noted.   - 10/29/2005 EKG shows SR 75 bpm with QRSd= 92 ms. Intellispace generated QT= with QTc = 465. Re-calculated with Bazett's formula QT= 361 ms, QTc = 404 ms     Paroxysmal atrial fibrillation with RVR  Paroxysmal atrial tachycardia  - CHADSVASC = 4  - Started on Apixaban 5mg  BID  - 08/29/18 Increased Toprol XL to 100mg  QD (from 75mg )   - Started Sotolol 120 mg po BID   - Baseline QTc = 430 ms  ???  NICM, echo pending  - last echo 2017 EF 55%  - c/o nonspecific DOE   - was previously scheduled for outpt stress  ???  Mitral Valve Repair 2010  - complicated by pericardial effusion     Recommendations:  Aware that patient has documented hx of long QT syndrome although EKGs in system reviewed do not show long QT. Patient has received two doses of sotolol 120 mg. The QT interval remains wnl. QTc= 430 ms two hours post second dose. OK to continue sotolol 120 mg. Will continue to monitor.    Tammy Heath had 18 beat run of monomorphic VT at 0148 this am. Asymptomatic.  Will monitor for increased ventricular ectopy and polymorphic. K is 4.2, Mg = 2.    Yesterday device changes made due to ICD shocking patient for AF with RVR that was in VT zone. VT zone now set at 190 bpm (from 170 bpm).    To have 2D echo today (previous echo in 2017), will assess for changes.    Pt now agreeable to taking NOAC (apixaban).    Please see attestation by Dr. Milas Kocher for final recommendations or changes to above plan.    Roosvelt Maser, APRN-C (pager [phone])  Heart Rhythm Management pager [phone]    Medicare attestation       Subjective:  In chair, ambulates ad lib. Reports headache today and took tylenol with almost complete relief. Was asymptomatic during 0148 episode of fast monomorphic VT today.   Denies chest pain, SOB, palpitations, dizziness and lightheadedness. Expressed concern about having Long QT syndrome and being on sotolol. Aware EKG 2 hours after each dose of sotolol to check QT interval and that baseline QTc was wnl.    Medications  Scheduled Meds:apixaban (ELIQUIS) tablet  5 mg, 5 mg, Oral, BID  aspirin EC tablet 81 mg, 81 mg, Oral, QDAY  cetirizine (ZyrTEC) tablet 10 mg, 10 mg, Oral, QDAY  metoprolol XL (TOPROL XL) tablet 100 mg, 100 mg, Oral, QDAY  sotaloL (BETAPACE) tablet 120 mg, 120 mg, Oral, Q10H*  vitamins, multiple tablet 1 tablet, 1 tablet, Oral, QDAY    Continuous Infusions:  PRN and Respiratory Meds:acetaminophen Q6H PRN      Objective                       Vital Signs: Last Filed                 Vital Signs: 24 Hour Range   BP: 124/57 (07/20 0700)  Temp: 36.3 ???C (97.4 ???F) (07/20 0700)  Pulse: 65 (07/20 0911)  Respirations: 18 PER MINUTE (07/20 0700)  SpO2: 96 % (07/20 0700)  Height: 165.1 cm (5' 5) (07/19 1542) BP: (109-147)/(49-69) Temp:  [36.2 ???C (97.1 ???F)-36.7 ???C (98 ???F)]   Pulse:  [59-70]   Respirations:  [16 PER MINUTE-18 PER MINUTE]   SpO2:  [94 %-98 %]    Intensity Pain Scale (Self Report): 2 (08/30/18 0911) Vitals:    08/29/18 0520 08/29/18 1300 08/29/18 1542   Weight: 69.9 kg (154 lb 1.6 oz) 69.9 kg (154 lb 1.6 oz) 68.8 kg (151 lb 9.6 oz)         Intake/Output Summary:  (Last 24 hours)    Intake/Output Summary (Last 24 hours) at 08/30/2018 1007  Last data filed at 08/30/2018 0700  Gross per 24 hour   Intake 600 ml   Output 800 ml   Net -200 ml           Body mass index is 25.23 kg/m???.    Physical Exam      GEN: no acute distress  HEENT:  no JVD  CHEST: clear to auscultation bilaterally  HEART: reg rhythm, nml rate; nml S1 & S2, no S3 or S4; no rub; no murmurs  ABD: soft, nontender, BS+  EXT: no c/c/e  NEURO: A&Ox3  SKIN: warm and dry    Lab Review  Hematology:    Lab Results   Component Value Date    HGB 14.9 08/30/2018    HCT 44.8 08/30/2018    PLTCT 232 08/30/2018    WBC 6.4 08/30/2018    NEUT 56 08/30/2018    ANC 3.60 08/30/2018    LYMPH 1.5 04/07/2012    ALC 1.84 08/30/2018    MONA 9 08/30/2018    AMC 0.58 08/30/2018    ABC 0.05 08/30/2018    BASOPHILS 0.0 04/07/2012    MCV 88.3 08/30/2018    MCHC 33.2 08/30/2018    MPV 10.1 08/30/2018    RDW 13.8 08/30/2018   , Coagulation:    Lab Results   Component Value Date    PTT 36.2 08/30/2018    INR 1.3 08/30/2018   , General Chemistry:    Lab Results   Component Value Date    NA 138 08/30/2018    K 4.2 08/30/2018    CL 106 08/30/2018    GAP 10 08/30/2018    BUN 21 08/30/2018    CR 0.85 08/30/2018    GLU 107 08/30/2018    CA 9.2 08/30/2018    ALBUMIN 3.8 08/30/2018    LACTIC 1.5 08/23/2008    MG 2.0 08/30/2018    TOTBILI 0.5 08/30/2018    and Mg and PO4:  Lab Results   Component Value Date    MG 2.0 08/30/2018     Telemetry SR 72 and Apace, Vsense at 60 bpm.  Note: At 0135 this morning pt had 18 beat run of monomorphic VT rate 660-420 ms. No AF overnight. Roosvelt Maser, APRN-C (pager [phone])  Heart Rhythm Management pager [phone]

## 2018-08-30 NOTE — Patient Education
Pharmacy Anticoagulation Teaching    Tammy Heath was provided with both verbal and written drug information about Apixaban. Discussion with [name] included: the medication regimen, dosing, monitoring, possible adverse effects, food/drug interactions to be aware of and OTC/herbal medication use. Emphasis was placed on the importance of medication compliance. [name] was also encouraged to contact the pharmacist with any further questions.    [place] Intern  08/30/2018

## 2018-08-31 ENCOUNTER — Encounter
Admit: 2018-08-29 | Discharge: 2018-08-31 | Disposition: A | Discharge status: Home / Self Care | Attending: Cardiovascular Disease | Admitting: Cardiovascular Disease

## 2018-08-31 ENCOUNTER — Encounter: Admit: 2018-08-30 | Discharge: 2018-08-30 | Attending: Cardiovascular Disease | Admitting: Cardiovascular Disease

## 2018-08-31 ENCOUNTER — Emergency Department: Admit: 2018-08-29 | Discharge: 2018-08-29 | Attending: Cardiovascular Disease

## 2018-08-31 DIAGNOSIS — Z952 Presence of prosthetic heart valve: Secondary | ICD-10-CM

## 2018-08-31 DIAGNOSIS — E785 Hyperlipidemia, unspecified: Secondary | ICD-10-CM

## 2018-08-31 DIAGNOSIS — I471 Supraventricular tachycardia: Secondary | ICD-10-CM

## 2018-08-31 DIAGNOSIS — T82897A Other specified complication of cardiac prosthetic devices, implants and grafts, initial encounter: Secondary | ICD-10-CM

## 2018-08-31 DIAGNOSIS — Z1159 Encounter for screening for other viral diseases: Secondary | ICD-10-CM

## 2018-08-31 DIAGNOSIS — Z8674 Personal history of sudden cardiac arrest: Secondary | ICD-10-CM

## 2018-08-31 DIAGNOSIS — I428 Other cardiomyopathies: Secondary | ICD-10-CM

## 2018-08-31 DIAGNOSIS — I4581 Long QT syndrome: Secondary | ICD-10-CM

## 2018-08-31 DIAGNOSIS — Z7901 Long term (current) use of anticoagulants: Secondary | ICD-10-CM

## 2018-08-31 DIAGNOSIS — I480 Paroxysmal atrial fibrillation: Secondary | ICD-10-CM

## 2018-08-31 LAB — PROTIME INR (PT): Lab: 1.3 MMOL/L — ABNORMAL HIGH (ref 0.8–1.2)

## 2018-08-31 LAB — CBC AND DIFF: Lab: 8.3 K/UL — ABNORMAL HIGH (ref 4.5–11.0)

## 2018-08-31 LAB — COMPREHENSIVE METABOLIC PANEL: Lab: 144 MMOL/L — ABNORMAL LOW (ref >60)

## 2018-08-31 LAB — MAGNESIUM: Lab: 2.1 mg/dL (ref 1.6–2.6)

## 2018-08-31 MED ORDER — ATORVASTATIN 20 MG PO TAB
20 mg | ORAL_TABLET | Freq: Every day | ORAL | 0 refills | Status: DC
Start: 2018-08-31 — End: 2018-09-16

## 2018-08-31 MED ORDER — SOTALOL 120 MG PO TAB
120 mg | ORAL_TABLET | Freq: Two times a day (BID) | ORAL | 0 refills | 30.00000 days | Status: DC
Start: 2018-08-31 — End: 2018-09-16

## 2018-08-31 MED ORDER — APIXABAN 5 MG PO TAB
5 mg | ORAL_TABLET | Freq: Two times a day (BID) | ORAL | 0 refills | Status: DC
Start: 2018-08-31 — End: 2018-09-16

## 2018-08-31 MED ORDER — METOPROLOL SUCCINATE 50 MG PO TB24
100 mg | ORAL_TABLET | Freq: Every day | ORAL | 3 refills | 90.00000 days | Status: DC
Start: 2018-08-31 — End: 2018-09-16

## 2018-08-31 NOTE — Progress Notes
General Progress Note    Name:  Tammy Heath   QIONG'E Date:  08/31/2018  Admission Date: 08/29/2018  LOS: 2 days                     Assessment and Plan     Principal Problem:    Atrial fibrillation with RVR (HCC)  Active Problems:    Nonischemic cardiomyopathy (HCC)    Automatic implantable cardioverter-defibrillator in situ    S/P MVR (mitral valve repair)    PAF (paroxysmal atrial fibrillation) (HCC)    Hyperlipidemia    ICD (implantable cardioverter-defibrillator) discharge    Chronic anticoagulationon apixaban    Tammy Heath is a 73 y.o. female  With past medical history of ventricular fibrillation cardiac arrest (08/2005), s/p ICD. 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 ICD interrogation showed atrial fibrillation with RVR with 6 shocks administered.   ???  # Atrial fibrillation with rapid ventricular rate, currently in sinus rhythm  # s/p inappropriate ICD shocks  - S/p multiple inappropriate shocks for Afib with RVR in 180's  - E/P reprogrammed device on 7/19 for higher threshold for shocks   PLAN  - Sotalol 120mg  q10H for 5 doses with EKG checks after each dose. Last dose 7/21 0830. No QTc prolongation yet. Did have run of possible NSVT last night while patient was walking. Will get ICD interrogation.   - Increasing Toprol XL from 75 to 100mg  daily   - Started Eliquis 7/19    ???  # Non ischemic cardiomyopathy  # Fatigue, dyspnea on exertion  # Hyperlipidemia  - Previous Echo 07/2015 with EF 55%  - Lipid panel: LDL 150's, ASCVD risk score of 12.5%\  - Echo 7/20: EF 55%  PLAN  - She had been planned for a nuclear stress test as an outpatient but has not been performed. Patient does report fatigue and shotness of breath on exertion. However, she also reports that she is able to walk more than 4 blocks without exertion. Stress test can be performed as an outpatient.   - Start atorvastatin 20mg  daily   ???  # s/p mitral valve replacement  ???  Supportive:  - DVT prophylaxis: Lovenox  - Diet: cardiac diet  - Dispo: Possible D/c 7/21     Patient seen and discussed with Dr. Thomas    [place] Bairon Klemann, DO  Internal Medicine, PGY-1  Available on Voalte  Page: [phone]      Subjective     Tammy Heath reports feeling quite well this morning. It [birth year] birthday today and wishes to go home if she could. She does not have any chest pain, palpitations. She has been eating/voiding/stooling without issue. No further complaints today.     Review of Systems:  Review of Systems   Constitutional: Negative for chills and fever.   Eyes: Negative for blurred vision, double vision and pain.   Respiratory: Negative for cough and shortness of breath.    Cardiovascular: Negative for chest pain, palpitations and orthopnea.   Gastrointestinal: Negative for abdominal pain, diarrhea, nausea and vomiting.   Genitourinary: Negative.    Musculoskeletal: Negative.    Skin: Negative for rash.   Neurological: Negative.          Objective  Vital Signs: Last Filed                 Vital Signs: 24 Hour Range   BP: 118/63 (07/21 0321)  Temp: 36.8 ???C (98.3 ???F) (07/21 0321)  Pulse: 59 (07/21 0321)  Respirations: 18 PER MINUTE (07/21 0321)  SpO2: 95 % (07/21 0321)  Height: 165.1 cm (65) (07/20 1200) BP: (102-118)/(49-66)   Temp:  [36.4 ???C (97.6 ???F)-36.9 ???C (98.4 ???F)]   Pulse:  [59-65]   Respirations:  [16 PER MINUTE-18 PER MINUTE]   SpO2:  [95 %-98 %]    Intensity Pain Scale (Self Report): 2 (08/30/18 0911) Vitals:    08/29/18 1542 08/30/18 1200 08/31/18 0500   Weight: 68.8 kg (151 lb 9.6 oz) 68.5 kg (151 lb) 68.6 kg (151 lb 3.2 oz)       Intake/Output Summary:  (Last 24 hours)    Intake/Output Summary (Last 24 hours) at 08/31/2018 1610  Last data filed at 08/31/2018 0209  Gross per 24 hour   Intake 480 ml Output 2575 ml   Net -2095 ml           Physical Exam  General:  Alert, pleasant, cooperative, no distress, appears stated age  Head:  Normocephalic, without obvious abnormality, atraumatic  Lungs:  Clear to auscultation bilaterally  Heart:   Regular rate and rhythm, S1, S2 normal, no murmur, click rub or gallop  Abdomen:  Soft, non-tender.  Bowel sounds normal.    Extremities: Extremities normal, atraumatic, no cyanosis or edema  Peripheral pulses   2+ and symmetric, all extremities  Skin: Skin color, texture, turgor normal.  No rashes or lesions    Medications     Medications  Scheduled Meds:apixaban (ELIQUIS) tablet 5 mg, 5 mg, Oral, BID  aspirin EC tablet 81 mg, 81 mg, Oral, QDAY  atorvastatin (LIPITOR) tablet 20 mg, 20 mg, Oral, QDAY  cetirizine (ZyrTEC) tablet 10 mg, 10 mg, Oral, QDAY  metoprolol XL (TOPROL XL) tablet 100 mg, 100 mg, Oral, QDAY  sotaloL (BETAPACE) tablet 120 mg, 120 mg, Oral, Q10H*  vitamins, multiple tablet 1 tablet, 1 tablet, Oral, QDAY    Continuous Infusions:  PRN and Respiratory Meds:acetaminophen Q6H PRN

## 2018-08-31 NOTE — Progress Notes
Mamye Goth discharged on 08/31/2018.  Equipment Removed: Telepack.  Discharge instructions reviewed with patient who stated understanding of all instructions given including discharge medications and follow up needs.   Valuables returned:   Personal Items / Valuables: Valuables/Belongings home with patient.  Home medications:    .  Functional assessment at discharge complete: Yes .

## 2018-08-31 NOTE — Discharge Instructions - Pharmacy
Physician Discharge Summary      Name: Tammy Heath  Medical Record Number: [id number]        Account Number:  [id number]  Date Of Birth:  07/28/1945                         Age:  73 years   Admit date:  08/29/2018                     Discharge date:  08/31/2018    Attending Physician:  Dr Maisie Fus               Service: Cardiology-1st Round/CCU- [id number]    Physician Summary completed by: [place] Aimee Heldman, DO     Reason for hospitalization: Inappropriate ICD shocks in the setting of Atrial fibrillation with RVR    Significant PMH:   Medical History:   Diagnosis Date   ??? Atrial fibrillation (HCC)    ??? Cardiac arrest (HCC)    ??? Cardiac arrest - ventricular fibrillation 8/07    S/p dual chamber defibrillator   ??? Chronic anticoagulation 08/30/2018   ??? History of cardiac arrest     9/07  VF arrest with sudden cardiac death  -Dual chamber ICD implanted ([place], [place]), [employer]  -Cardiac cath:  Normal Cor's, EF 55%, MVP 6/10   VF arrest s/p successful ICD shocks, found to have severe MR with MVP    ??? Mitral regurgitation 08/04/2008   ??? Mitral valve prolapse 08/04/2008   ??? PAF (paroxysmal atrial fibrillation) (HCC)     7/10:  Post op atrial fibrillation  -Started on amiodarone            Allergies: Adhesive tape-silicones    Admission Physical Exam notable for:     GENERAL: The patient is well developed, well nourished, resting comfortably and in no distress.   HEENT: No abnormalities of the visible oro-nasopharynx, conjunctiva or sclera are noted.  NECK: There is no jugular venous distension. Carotids are palpable and without bruits. There is no thyroid enlargement.  Chest: Lung fields are clear to auscultation. There are no wheezes or crackles.  CV: There is a regular rhythm. The first and second heart sounds are normal. There are no murmurs, gallops or rubs.  ABD: The abdomen is soft and supple with normal bowel sounds. There is no hepatosplenomegaly, ascites, tenderness, masses or bruits.  Neuro: There are no focal motor defects. Ext: There is no edema or evidence of deep vein thrombosis. Peripheral pulses are satisfactory.    SKIN: There are no rashes and no cellulitis  PSYCH: The patient is calm, rationale and oriented.      Admission Lab/Radiology studies notable for:   Troponin-I 0.49   TSH 2.26  CBC unremarkable  CMP unremarkable     Brief Hospital Course:  The patient was admitted and the following issues were addressed during this hospitalization: (with pertinent details).      Tammy Heath???is a 73 y.o.???female??????With past medical history of ventricular fibrillation cardiac arrest (08/2005), s/p ICD. 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 ICD interrogation showed atrial fibrillation with RVR in the 180's with 6 shocks administered. Initial troponin of 0.49 trended down, and she had no chest pain. She was in Sinus rhythm with normal rate by the time the patient was admitted to the floor. The  patient remained in normal SR for the remainder of her stay while on telemetry. E/P was consulted and reprogrammed her ICD to have the VT zone set at 190, up from 170 previously. She was started on Sotalol 120mg  Q10H for 5 doses in the hospital with EKG after each dose; patient did not show prohibitive prolongation of the QTc. She was also started on atorvastatin 20mg  with ASCVD risk of 12.5%. 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 Toprol XL was increased form 75 to 100mg . A 2D Echo was obtained as well with an EF of 55% and mild ventricular diastolic dysfunction, largely unchanged form previous in 2017. ICD interrogation before discharge showed no new events. After the last dose of sotalol, the patient was deemed hemodynamically stable and was discharged to home on her birthday with plan to continue sotalol 120mg  BID and have follow up with E/P in Dr. Bradly Bienenstock.       Condition at Discharge: Stable    Discharge Diagnoses:       Hospital Problems        Active Problems    Nonischemic cardiomyopathy (HCC)    Automatic implantable cardioverter-defibrillator in situ    S/P MVR (mitral valve repair)    PAF (paroxysmal atrial fibrillation) (HCC)    Hyperlipidemia    ICD (implantable cardioverter-defibrillator) discharge    Chronic anticoagulationon apixaban       Resolved Problems    * (Principal) RESOLVED: Atrial fibrillation with RVR (HCC)          Surgical Procedures: None    Significant Diagnostic Studies and Procedures: noted in brief hospital course    Consults:  Cardiology - EP    Patient Disposition: Home       Patient instructions/medications:       Cardiac Diet    Limiting unhealthy fats and cholesterol is the most important step you can take in reducing your risk for cardiovascular disease.  Unhealthy fats include saturated and trans fats.  Monitor your sodium and cholesterol intake.  Restrict your sodium to 2g (grams) or 2000mg  (milligrams) daily, and your cholesterol to 200mg  daily.    If you have questions regarding your diet at home, you may contact a dietitian at [phone].       Report These Signs and Symptoms    Please contact your doctor if you have any of the following symptoms: temperature higher than 100.4 degrees F, uncontrolled pain, persistent nausea and/or vomiting, difficulty breathing, chest pain, severe abdominal pain, headache, unable to urinate or unable to have bowel movement.     Risk Reduction Plan    Cardiac Event Personal Risk Factor Reduction Plan  **Take this sheet to your physician to show treatment recommendations**  Jaiyah Pinera  Admission Date: 08/29/2018 LOS: @LOS @       Blood Pressure Risk Goal: Keep blood pressure below 130/80  Your Numbers:  BP Readings from Last 1 Encounters:  08/31/18 : 106/62     Plan: High blood pressure is the single most important risk factor for cardiac events because it's the #1 cause of cardiac events.  Take medication as prescribed and monitor your blood pressure.    Abnormal lipids (fats in blood) Risk   Goal: Total Cholesterol: <200  LDL (bad cholesterol): primary prevention <100   LDL (bad cholesterol): secondary prevention < 70  HDL (good cholesterol): >40 for  men, >50 for women  Triglycerides: <150  Your Numbers: Cholesterol       Date                     Value               Ref Range           Status                08/29/2018               217 (H)             <200 MG/DL          Final            ----------  LDL       Date                     Value               Ref Range           Status                08/29/2018               145 (H)             <100 mg/dL          Final            ----------  HDL       Date                     Value               Ref Range           Status                08/29/2018               47                  >40 MG/DL           Final            ----------  Triglycerides       Date                     Value               Ref Range           Status                08/29/2018               175 (H)             <150 MG/DL          Final            ----------  Plan: Diets high in saturated fat, trans fat and cholesterol can raise blood cholesterol levels increasing your risk of having a cardiac event.  Take medication as prescribed and eat a heart healthy, low sodium diet.    Smoking Risk   Goals: If you smoke, STOP!   Plan: Smoking DOUBLES your risk of having a cardiac event. Quitting can greatly reduce your risk. To register for smoking cessation program call [phone] or visit www.smokefree.gov    Diabetes Risk   Goal:  Non-diabetic: Below 5.7%  Goal for diabetic: Less than 7%  Your Numbers: Hemoglobin A1C Date                     Value               Ref Range           Status                08/08/2008               5.6                 4.0 - 6.0 %         Final              Comment:    NOTE NEW REFERENCE RANGES    For patients with Type I or Type II Diabetes Mellitus, the ADA recommends     maintaining the A1c level <7%.  ----------  Plan: If you have diabetes, even if treated, you are at an increased risk of having a cardiac event.     Alcohol Use Risk  Goal: Alcohol use can lead to a cardiac event.  Plan: For men, limit intake to no more than 2 drinks per day.  For women, limit intake to no more than one drink per day.    Weight Management Risk   Goal: Healthy: BMI is 18.5 to 24.9  Overweight: BMI is 25 to 29.9  Obese: BMI is 30 or higher  Morbid Obesity: BMI [...]  Your Numbers: Your BMI (Calculated): 25.13  Plan: If you have questions about your diet after you go home, you can call a dietitian at [phone]    Physical Activity Risk   Goal: Patients should have approval by a physician prior to beginning an exercise program.  Plan: Try to get at least 30 minutes of moderate physical activity five days a week or 20 minutes of vigorous physical activity three days a week with your doctor's approval.    To the Neurology and the NeuroSurg Discharge order sets set add a new order in the education section titled Risk Reduction Plan, in the comments section add the following (default select this new order for neuro and neuro surg and ensure this information is added to the AVS).     Questions About Your Stay    For questions or concerns regarding your hospital stay:    - DURING BUSINESS HOURS (8:00 AM - 4:30 PM):    Call [phone] and asked to be transferred to your discharge attending physician.    - AFTER BUSINESS HOURS (4:30 PM - 8:00 AM, on weekends, or holidays):  Call [phone] and ask the operator to page the on-call doctor for the discharge attending physician. Discharging attending physician: Adonis Huguenin [[id number]]      Activity as Tolerated    It is important to keep increasing your activity level after you leave the hospital.  Moving around can help prevent blood clots, lung infection (pneumonia) and other problems.  Gradually increasing the number of times you are up moving around will help you return to your normal activity level more quickly.  Continue to increase the number of times you are up to the chair and walking daily to return to your normal activity level. Begin to work towards your normal activity level at discharge.     DEVICE EVALUATION - ICD   Standing Status: Future Standing Exp. Date: 08/31/19  Lab Results       Component                Value               Date/Time                  GENERATOR                                    08/30/2018 12:08 PM    [employer]       EPDEVTYP                 DDD-ICD             08/30/2018 12:08 PM      Scheduling Priority: Routine Please schedule on same day as office visit with Dr. Sharyl Nimrod in August.      Current Discharge Medication List       START taking these medications    Details   apixaban (ELIQUIS) 5 mg tablet Take one tablet by mouth twice daily.  Qty: 60 tablet, Refills: 0    PRESCRIPTION TYPE:  Normal      atorvastatin (LIPITOR) 20 mg tablet Take one tablet by mouth daily.  Qty: 90 tablet, Refills: 0    PRESCRIPTION TYPE:  Normal      sotaloL (BETAPACE) 120 mg tab Take one tablet by mouth every 12 hours. Indications: Baseline QTC 430 ms  Qty: 180 tablet, Refills: 0    PRESCRIPTION TYPE:  Normal          CONTINUE these medications which have been CHANGED or REFILLED    Details   metoprolol XL (TOPROL XL) 50 mg extended release tablet Take two tablets by mouth daily.  Qty: 135 tablet, Refills: 3    PRESCRIPTION TYPE:  Normal          CONTINUE these medications which have NOT CHANGED    Details   acetaminophen (TYLENOL) 325 mg tablet Take 650 mg by mouth every 4 hours as needed for Pain. PRESCRIPTION TYPE:  Historical Med      aspirin EC 81 mg tablet Take 81 mg by mouth daily.      PRESCRIPTION TYPE:  Historical Med      cetirizine (ZYRTEC) 10 mg PO tablet Take 10 mg by mouth daily.    PRESCRIPTION TYPE:  Historical Med  Associated Diagnoses: Nonischemic cardiomyopathy (HCC); Mitral regurgitation; Mitral valve prolapse      multivitamin (THERAGRAN) per tablet Take 1 Tab by mouth Daily.    PRESCRIPTION TYPE:  Historical Med      other medication 1 Dose. Alkalete (acid reducer): Take 1 capsule daily as needed.    PRESCRIPTION TYPE:  Historical Med           Scheduled appointments:    Nov 18, 2018  3:30 PM CDT  ICD Check with CMPB3 PACEMAKER  [facility] ([facility] Procedural) [id number]  [address]  [place] [address]  [phone]   Nov 18, 2018  4:30 PM CDT  Return Patient with Kathreen Cornfield, MD  [facility] (CVM Exam) [phone] [address]  [address]  [place] [address]  [phone]          Pending items needing follow up:   1. E/P follow up with Dr. Bradly Bienenstock   2. ICD check  as above     Signed:  [place] Kunio Cummiskey, DO  08/31/2018      cc:  Primary Care Physician:  Lona Kettle   Verified  Referring physicians:  Lona Kettle, MD   Additional provider(s):

## 2018-08-31 NOTE — Care Plan
Problem: Discharge Planning  Goal: Participation in plan of care  Outcome: Goal Ongoing  Goal: Knowledge regarding plan of care  Outcome: Goal Ongoing  Goal: Prepared for discharge  Outcome: Goal Ongoing     5th dose Sotalol scheduled this AM at 0830. EKG at 1030. D/C after.   Updated pt on POC, no questions at this time.

## 2018-08-31 NOTE — Progress Notes
Electrophysiology Progress Note    Admission Date: 08/29/2018  Today's Date: 08/31/2018  LOS: 2 days    Assessment & Plan   Tammy Heath is a 73 y.o. female patient with the following problems:    Principal Problem:    Atrial fibrillation with RVR (HCC)  Active Problems:    Nonischemic cardiomyopathy (HCC)    Automatic implantable cardioverter-defibrillator in situ    S/P MVR (mitral valve repair)    PAF (paroxysmal atrial fibrillation) (HCC)    Hyperlipidemia    ICD (implantable cardioverter-defibrillator) discharge    Chronic anticoagulationon apixaban    Assessment:  Principal Problem:  ??????ICD (implantable cardioverter-defibrillator) discharge  Active Problems:  ??????Implantable cardioverter-defibrillator (ICD) discharge  ???  Follows with Dr. Bradly Bienenstock for EP  ???  ???Cardiac Arrest 2007 s/p ICD.  Diagnosed with Long QT syndrome  - dual chamber [employer] DDD-ICD implanted 2007  -   - device interrogation 6/2 shows NSVT episodes, but EGMs suggest AT with 1:1 conduction, no AF  - device interrogation 7/19 shows AF RVR with rates???180s???with 6 shocks delivered  - PTA Toprol XL 75mg  daily  - EKG's in [place] reviewed. No obvious QT prolongation noted.               - 10/29/2005 EKG shows SR 75 bpm with QRSd= 92 ms. Intellis[employer] generated QT= with QTc = 465. Re-calculated with Bazett's formula QT= 361 ms, QTc = 404 ms  ???  Paroxysmal atrial fibrillation with RVR  Paroxysmal atrial tachycardia  - CHADSVASC = 4  - Started on Apixaban 5mg  BID  - 08/29/18 Increased Toprol XL to 100mg  QD (from 75mg )               - Started Sotolol 120 mg po BID               - Baseline QTc = 430 ms  ???  NICM, echo pending  - last echo 2017 EF 55%  - c/o nonspecific DOE   - was previously scheduled for outpt stress  ???  Mitral Valve Repair???2010  - complicated by pericardial effusion???    Plan:    Patient had fifth dose of sotolol 120 mg at 0830 this am. EKG done two hours post shows QT = 443 ms (SR at 60 bpm with QRSd 89 ms). OK to continue sotolol 120mg  at current dose.    Continue metoprolol XL 100 mg and Apixaban. BP stable.    No AF noted overnight.    Follow-up appointment with Dr. Bradly Bienenstock requested.    Thank you for the opportunity to care for this patient.   EP will sign off.   Please call with any questions or concerns.     Roosvelt Maser, APRN-C (pager [phone])  Heart Rhythm Management pager [phone]    Medicare attestation       Subjective:  Tammy Heath feels good. Is ambulating ad lib. Denies CP, SOA, dizziness, lightheadedness. Anticipating d/c home    Medications  Scheduled Meds:apixaban (ELIQUIS) tablet 5 mg, 5 mg, Oral, BID  aspirin EC tablet 81 mg, 81 mg, Oral, QDAY  atorvastatin (LIPITOR) tablet 20 mg, 20 mg, Oral, QDAY  cetirizine (ZyrTEC) tablet 10 mg, 10 mg, Oral, QDAY  metoprolol XL (TOPROL XL) tablet 100 mg, 100 mg, Oral, QDAY  sotaloL (BETAPACE) tablet 120 mg, 120 mg, Oral, Q10H*  vitamins, multiple tablet 1 tablet, 1 tablet, Oral, QDAY    Continuous Infusions:  PRN and Respiratory Meds:acetaminophen Q6H PRN  Objective                       Vital Signs: Last Filed                 Vital Signs: 24 Hour Range   BP: 125/75 (07/21 0700)  Temp: 36.7 ???C (98.1 ???F) (07/21 0700)  Pulse: 63 (07/21 0700)  Respirations: 18 PER MINUTE (07/21 0700)  SpO2: 97 % (07/21 0700)  Height: 165.1 cm (5' 5) (07/20 1200) BP: (102-125)/(49-75)   Temp:  [36.4 ???C (97.6 ???F)-36.9 ???C (98.4 ???F)]   Pulse:  [59-63]   Respirations:  [16 PER MINUTE-18 PER MINUTE]   SpO2:  [95 %-98 %]      Vitals:    08/29/18 1542 08/30/18 1200 08/31/18 0500   Weight: 68.8 kg (151 lb 9.6 oz) 68.5 kg (151 lb) 68.6 kg (151 lb 3.2 oz)         Intake/Output Summary:  (Last 24 hours)    Intake/Output Summary (Last 24 hours) at 08/31/2018 1106  Last data filed at 08/31/2018 0837  Gross per 24 hour   Intake 600 ml   Output 2575 ml   Net -1975 ml           Body mass index is 25.16 kg/m???.    Physical Exam        GEN: no acute distress CHEST: respirations even and unlabored  HEART: reg rhythm, nml rate  NEURO: A&Ox3  SKIN: warm and dry      Lab Review  Hematology:    Lab Results   Component Value Date    HGB 15.8 08/31/2018    HCT 46.0 08/31/2018    PLTCT 243 08/31/2018    WBC 8.3 08/31/2018    NEUT 61 08/31/2018    ANC 5.04 08/31/2018    LYMPH 1.5 04/07/2012    ALC 2.01 08/31/2018    MONA 10 08/31/2018    AMC 0.81 08/31/2018    ABC 0.05 08/31/2018    BASOPHILS 0.0 04/07/2012    MCV 88.6 08/31/2018    MCHC 34.4 08/31/2018    MPV 9.5 08/31/2018    RDW 13.5 08/31/2018   , General Chemistry:    Lab Results   Component Value Date    NA 144 08/31/2018    K 4.6 08/31/2018    CL 111 08/31/2018    GAP 12 08/31/2018    BUN 21 08/31/2018    CR 0.93 08/31/2018    GLU 93 08/31/2018    CA 9.6 08/31/2018    ALBUMIN 4.1 08/31/2018    LACTIC 1.5 08/23/2008    MG 2.1 08/31/2018    TOTBILI 0.6 08/31/2018   , Mg and PO4:   Lab Results   Component Value Date    MG 2.1 08/31/2018    and Endocrine:   Lab Results   Component Value Date    TSH 2.26 08/29/2018     Telemetry Apace, Vsense at 60 bpm to SR up to 101 bpm    ECG  Baseline QTc = 430 ms  QTc after 5th dose: 443 ms (SR 60 bpm, QRSd 89 ms)      Roosvelt Maser, APRN-C (pager [phone])  Heart Rhythm Management pager [phone]

## 2018-09-03 ENCOUNTER — Encounter: Admit: 2018-09-03 | Discharge: 2018-09-03

## 2018-09-16 ENCOUNTER — Encounter: Admit: 2018-09-16 | Discharge: 2018-09-16

## 2018-09-16 DIAGNOSIS — I4581 Long QT syndrome: Secondary | ICD-10-CM

## 2018-09-16 DIAGNOSIS — I480 Paroxysmal atrial fibrillation: Secondary | ICD-10-CM

## 2018-09-16 DIAGNOSIS — Z95810 Presence of automatic (implantable) cardiac defibrillator: Secondary | ICD-10-CM

## 2018-09-16 DIAGNOSIS — Z8674 Personal history of sudden cardiac arrest: Secondary | ICD-10-CM

## 2018-09-16 DIAGNOSIS — Z7901 Long term (current) use of anticoagulants: Secondary | ICD-10-CM

## 2018-09-16 DIAGNOSIS — I428 Other cardiomyopathies: Principal | ICD-10-CM

## 2018-09-16 DIAGNOSIS — I4891 Unspecified atrial fibrillation: Secondary | ICD-10-CM

## 2018-09-16 DIAGNOSIS — I340 Nonrheumatic mitral (valve) insufficiency: Secondary | ICD-10-CM

## 2018-09-16 DIAGNOSIS — I341 Nonrheumatic mitral (valve) prolapse: Secondary | ICD-10-CM

## 2018-09-16 DIAGNOSIS — I469 Cardiac arrest, cause unspecified: Secondary | ICD-10-CM

## 2018-09-16 DIAGNOSIS — I471 Supraventricular tachycardia: Secondary | ICD-10-CM

## 2018-09-16 MED ORDER — ATORVASTATIN 20 MG PO TAB
20 mg | ORAL_TABLET | Freq: Every day | ORAL | 3 refills | Status: AC
Start: 2018-09-16 — End: ?

## 2018-09-16 MED ORDER — SOTALOL 120 MG PO TAB
120 mg | ORAL_TABLET | Freq: Two times a day (BID) | ORAL | 3 refills | 30.00000 days | Status: AC
Start: 2018-09-16 — End: ?

## 2018-09-16 MED ORDER — APIXABAN 5 MG PO TAB
5 mg | ORAL_TABLET | Freq: Two times a day (BID) | ORAL | 3 refills | Status: DC
Start: 2018-09-16 — End: 2019-08-15

## 2018-09-16 MED ORDER — METOPROLOL SUCCINATE 100 MG PO TB24
100 mg | ORAL_TABLET | Freq: Every day | ORAL | 3 refills | 90.00000 days | Status: DC
Start: 2018-09-16 — End: 2019-05-23

## 2018-09-16 NOTE — Progress Notes
Date of Service: 09/16/2018    Tammy Heath is a 73 y.o. female.       HPI     Tammy Heath presents to my office today in electrophysiology follow up for atrial arrhythmias and defibrillator.  As you know, she is a pleasant 73 year old female with a past medical history of mitral valve prolapse with regurgitation status post mitral valve repair complicated by pericardial effusion and Dressler's syndrome, ventricular fibrillation status post defibrillator implantation and paroxysmal atrial arrhythmias who was last seen in the office by me in October 2019.  At that time, she was feeling well without any specific complaints.  We made plans to see each other back in the office in one year.    Tammy Heath returns to my office today accompanied by her friend after a recent hospitalization at [facility] for a shock.  She tells me that her heart started beating fast and irregularly one night.  It was not associate with chest pain or shortness of breath.  She asked her friend to take her to the ER but then got shocked.  In the ER, her device was interrogated and she was noted to have AF with RVR terminated with ICD shock.  She was shocked 5 more times en route to hospital.  She was admitted and started on sotalol.    Tammy Heath tells me she is feeling well today.  She can't recall any prior episodes of palpitations like this.  She has tolerated sotalol without difficulty .  Patient denies chest pain, shortness of breath, dizziness, or syncope.  There are no palpitations and energy level is great.  She is complaining of some nonspecific fatigue and dyspnea on exertion.  Echocardiogram during her admission showed normal LV function and appropriately functioning mitral valve.    Device interrogation today shows no new arrhythmias.  She is set to DDD.       Vitals:    09/16/18 1442   BP: 126/86   BP Source: Arm, Left Upper   Pulse: 60   SpO2: 97%   Weight: 68.5 kg (151 lb)   Height: 1.651 m (5' 5)   PainSc: Zero Body mass index is 25.13 kg/m???.     Past Medical History  Patient Active Problem List    Diagnosis Date Noted   ??? Chronic anticoagulationon apixaban 08/30/2018   ??? ICD (implantable cardioverter-defibrillator) discharge 08/29/2018   ??? Hyperlipidemia 08/29/2010   ??? History of cardiac arrest      9/07  VF arrest with sudden cardiac death   -Dual chamber ICD implanted ([place], [place]), Bos Sci   -Cardiac cath:  Normal Cor's, EF 55%, MVP  6/10   VF arrest s/p successful ICD shocks, found to have severe MR with MVP     ??? PAF (paroxysmal atrial fibrillation) (HCC)      7/10:  Post op atrial fibrillation   -Started on amiodarone       ??? S/P MVR (mitral valve repair) 09/01/2008     08/17/08 ([facility]):  Right video-assisted thoracoscopy and robotic-assisted repair of mitral valve with triangular resection of P2 and implantation of 27-mm posterior annuloplasty band.  Repair of right atrial tear.  07/25/2015 - ECHO:       ??? SVT (supraventricular tachycardia) (HCC) 08/22/2008   ??? Long QT syndrome 08/22/2008   ??? Nonischemic cardiomyopathy (HCC) 08/04/2008     7/11 echo:  Normal chamber sizes other than mild right atrial enlargement   Normal ventricular systolic function   EF = 55%  Normal cardiac valve structure and function other than mild TV regurgitation   Normal pulmonary artery pressure.   PAP =   No significant pericardial effusion     7/13 stress echo:  Normal LV systolic function   Mild LV diastolic dysfunction   Non-ischemic response to exercise   Normal post-mitral valvuloplasty appearance   Bi-atrial enlargement   Normal PA pressure    08/30/2018 - ECHO:  The left ventricular systolic function is normal, the visually estimated ejection fraction is 55%.  Grade I (mild) left ventricular diastolic dysfunction.  The right ventricle is mildly dilated with mildly reduced systolic function, ICD lead is present.  Mild biatrial dilatation.  There is severe mitral annular calcification without stenosis.  Trace mitral valve regurgitation, mild to moderate tricuspid valve regurgitation.  Estimated Peak Systolic PA Pressure 28 mmHg         ??? Mitral regurgitation 08/04/2008     08/10/08 cardiac cath [facility]):  No significant angiographic coronary artery stenosis.  1. Preserved LV systolic function with EF 55 percent, though in the presence of severe mitral regurgitation. The EF is not hyperdynamic, thus likely represents some systolic dysfunction.  2. Moderate pulmonary artery hypertension.  3. Diminished cardiac output at 3 liters per minute by thermodilution and 3.4 liters per minute by Fick.  Severe grade 4 mitral regurgitation with severe mitral valve prolapse is noted.  08/17/08 ([facility])  Right video-assisted thoracoscopy and robotic-assisted repair of mitral valve with triangular resection of P2 and implantation of 27-mm posterior annuloplasty band. Repair of right atrial tear  09/03/08  Readmitted for pericardial effusion and tamponade (INR 10):  S/p drain       ??? Mitral valve prolapse 08/04/2008     7/09 echo:  Left Ventricular Ejection Fraction: 55%  Normal Left Ventricular Wall Thickness  No Regional Wall Motion Abnormalities  Right ventricular systolic function appears normal  Normal Chamber Dimensions  Mitral Valve Prolapse, PASP 26 mm Hg, mild-moderate MR  Valve Structures Otherwise Unremarkable  No Effusions, Masses or Thrombi  Pacemaker Electrode In Right Atrium And Right Ventricle  Normal Inferior Vena Cava Consistent With Normal Central Venous Pressure           ??? Automatic implantable cardioverter-defibrillator in situ 08/04/2008     7/10 cardiac cath:  FINAL IMPRESSION:   4. No significant angiographic coronary artery stenosis.  5. Preserved LV systolic function with EF 55 percent, though in the presence of severe mitral regurgitation. The EF is not hyperdynamic, thus likely represents some systolic dysfunction.  6. Moderate pulmonary artery hypertension. 7. Diminished cardiac output at 3 liters per minute by thermodilution and 3.4 liters per minute by Fick.  8. Severe grade 4 mitral regurgitation with severe mitral valve prolapse is noted.    7/13 stress echo:  Normal LV systolic function   Mild LV diastolic dysfunction   Non-ischemic response to exercise   Normal post-mitral valvuloplasty appearance   Bi-atrial enlargement   Normal PA pressure    04/16/12 ICD Generator Change w/RCP: [facility] INCEPTA ICD E163              Review of Systems   Constitution: Negative.   HENT: Negative.    Eyes: Negative.    Cardiovascular: Negative.    Respiratory: Negative.    Endocrine: Negative.    Hematologic/Lymphatic: Negative.    Skin: Negative.    Musculoskeletal: Negative.    Gastrointestinal: Negative.    Genitourinary: Negative.    Neurological: Negative.    Psychiatric/Behavioral:  Negative.    Allergic/Immunologic: Negative.        Physical Exam  General Appearance: well developed, well nourished in no acute distress  Skin: warm, moist, no ulcers  HEENT: extraocular movements intact, oropharynx clear  Neck Veins: neck veins are flat, neck veins are not distended  Carotid Arteries: normal carotid upstroke bilaterally, no bruits  Chest Inspection: chest is normal in appearance  Auscultation/Percussion: lungs clear to auscultation, no rales, rhonchi, or wheezing  Cardiac Rhythm: regular rhythm and normal rate  Cardiac Auscultation: Normal S1 & S2, no S3 or S4, no rub  Murmurs: no cardiac murmurs   Extremities: no lower extremity edema; 2+ symmetric distal pulses  Abdominal Exam: soft, non-tender, no masses, bowel sounds normal  Liver & Spleen: no organomegaly  Neurologic Exam: neurological assessment grossly intact      Cardiovascular Studies  12 lead EKG:  Sinus rhythm, ventricular rate 60 bpm, QTc 448 msec, PRWP across precordium      Problems Addressed Today  Encounter Diagnoses   Name Primary?   ??? PAF (paroxysmal atrial fibrillation) (HCC) Yes ??? SVT (supraventricular tachycardia) (HCC)    ??? Nonischemic cardiomyopathy (HCC)    ??? Automatic implantable cardioverter-defibrillator in situ        Assessment and Plan       PAF (paroxysmal atrial fibrillation) (HCC)  She has had a recent ICD shock for atrial fibrillation with rapid ventricular rate.  Ms. Mersch has been started on sotalol and appears to be tolerating it well.  We did talk again about atrial fibrillation, its risks and treatment options.  I am not entirely surprised that Yurika has developed AF with her history of mitral valve disease.  She is agreeable to staying on anticoagulation at this point (CHADS2VASC score of 3).      Nonischemic cardiomyopathy (HCC)  Her last echo revealed normal LV function without regional wall motion abnormalities.  Her mitral valve appears to be functioning appropriately.  I did not make any changes to her medical regimen.    Automatic implantable cardioverter-defibrillator in situ  Device interrogation today shows stable sensing and pacing thresholds.  She has had no further episodes of atrial fibrillation.  Ms. Fofana is complaining of some increased dyspnea on exertion and fatigue.  I have reprogrammed her device to DDDR in order to provide some rate response.  We will see if this is helpful.  If she continues to have complaints, then we might consider an ischemia evaluation.    I have asked the patient to see me back in follow up in one year.      Current Medications (including today's revisions)  ??? acetaminophen (TYLENOL) 325 mg tablet Take 650 mg by mouth every 4 hours as needed for Pain.   ??? apixaban (ELIQUIS) 5 mg tablet Take one tablet by mouth twice daily.   ??? aspirin EC 81 mg tablet Take 81 mg by mouth daily.     ??? atorvastatin (LIPITOR) 20 mg tablet Take one tablet by mouth daily.   ??? cetirizine (ZYRTEC) 10 mg PO tablet Take 10 mg by mouth daily.   ??? metoprolol XL (TOPROL XL) 100 mg extended release tablet Take one tablet by mouth daily. ??? multivitamin (THERAGRAN) per tablet Take 1 Tab by mouth Daily.   ??? other medication 1 Dose. Alkalete (acid reducer): Take 1 capsule daily as needed.   ??? sotaloL (BETAPACE) 120 mg tab Take one tablet by mouth every 12 hours. Indications: Baseline QTC 430 ms

## 2018-09-16 NOTE — Patient Instructions
please schedule an appointment with Dr. Pimentel in 1 year .  To schedule an appointment call [phone].     In order to provide you the best care possible we ask that you follow up as below:    For non-urgent questions please contact us through your MyChart account.   For all medication refills please contact your pharmacy or send a request through MyChart.     For all questions that may need to be addressed urgently please call the nursing triage voicemail at [phone] Monday - Friday 8-5 only. Please leave a detailed message with your name, date of birth, and reason for your call.      Please allow ~ 10 business days for the results of any testing to be reviewed. Please call our office if you have not heard from a nurse within this time frame.

## 2018-09-17 ENCOUNTER — Ambulatory Visit: Admit: 2018-09-16 | Discharge: 2018-09-17

## 2018-09-17 ENCOUNTER — Ambulatory Visit: Admit: 2018-09-16 | Discharge: 2018-09-16

## 2018-09-17 DIAGNOSIS — I480 Paroxysmal atrial fibrillation: Secondary | ICD-10-CM

## 2018-09-17 DIAGNOSIS — I471 Supraventricular tachycardia: Secondary | ICD-10-CM

## 2018-09-19 ENCOUNTER — Encounter: Admit: 2018-09-19 | Discharge: 2018-09-19

## 2018-09-19 DIAGNOSIS — I340 Nonrheumatic mitral (valve) insufficiency: Secondary | ICD-10-CM

## 2018-09-19 DIAGNOSIS — I4891 Unspecified atrial fibrillation: Secondary | ICD-10-CM

## 2018-09-19 DIAGNOSIS — I469 Cardiac arrest, cause unspecified: Secondary | ICD-10-CM

## 2018-09-19 DIAGNOSIS — Z8674 Personal history of sudden cardiac arrest: Secondary | ICD-10-CM

## 2018-09-19 DIAGNOSIS — I480 Paroxysmal atrial fibrillation: Secondary | ICD-10-CM

## 2018-09-19 DIAGNOSIS — Z7901 Long term (current) use of anticoagulants: Secondary | ICD-10-CM

## 2018-09-19 DIAGNOSIS — I341 Nonrheumatic mitral (valve) prolapse: Secondary | ICD-10-CM

## 2018-09-20 NOTE — Assessment & Plan Note
She has had a recent ICD shock for atrial fibrillation with rapid ventricular rate.  [name] has been started on sotalol and appears to be tolerating it well.  We did talk again about atrial fibrillation, its risks and treatment options.  I am not entirely surprised that Tammy Heath has developed AF with her history of mitral valve disease.  She is agreeable to staying on anticoagulation at this point (CHADS2VASC score of 3).

## 2018-09-20 NOTE — Assessment & Plan Note
Her last echo revealed normal LV function without regional wall motion abnormalities.  Her mitral valve appears to be functioning appropriately.  I did not make any changes to her medical regimen.

## 2018-10-14 ENCOUNTER — Ambulatory Visit: Admit: 2018-10-14 | Discharge: 2018-10-15

## 2018-10-14 DIAGNOSIS — Z95810 Presence of automatic (implantable) cardiac defibrillator: Secondary | ICD-10-CM

## 2018-10-14 DIAGNOSIS — I480 Paroxysmal atrial fibrillation: Secondary | ICD-10-CM

## 2018-10-15 DIAGNOSIS — I428 Other cardiomyopathies: Secondary | ICD-10-CM

## 2019-01-13 ENCOUNTER — Ambulatory Visit: Admit: 2019-01-13 | Discharge: 2019-01-13 | Payer: MEDICARE

## 2019-01-13 DIAGNOSIS — I428 Other cardiomyopathies: Secondary | ICD-10-CM

## 2019-01-13 DIAGNOSIS — I480 Paroxysmal atrial fibrillation: Secondary | ICD-10-CM

## 2019-01-13 DIAGNOSIS — Z95810 Presence of automatic (implantable) cardiac defibrillator: Secondary | ICD-10-CM

## 2019-02-24 ENCOUNTER — Encounter: Admit: 2019-02-24 | Discharge: 2019-02-24 | Payer: MEDICARE

## 2019-02-24 DIAGNOSIS — Z95810 Presence of automatic (implantable) cardiac defibrillator: Secondary | ICD-10-CM

## 2019-02-24 DIAGNOSIS — I480 Paroxysmal atrial fibrillation: Secondary | ICD-10-CM

## 2019-02-24 DIAGNOSIS — I428 Other cardiomyopathies: Secondary | ICD-10-CM

## 2019-04-14 ENCOUNTER — Ambulatory Visit: Admit: 2019-04-14 | Discharge: 2019-04-14 | Payer: MEDICARE

## 2019-04-14 DIAGNOSIS — Z95810 Presence of automatic (implantable) cardiac defibrillator: Secondary | ICD-10-CM

## 2019-04-14 DIAGNOSIS — I428 Other cardiomyopathies: Secondary | ICD-10-CM

## 2019-04-14 DIAGNOSIS — I480 Paroxysmal atrial fibrillation: Secondary | ICD-10-CM

## 2019-05-11 ENCOUNTER — Encounter: Admit: 2019-05-11 | Discharge: 2019-05-11 | Payer: MEDICARE

## 2019-05-20 ENCOUNTER — Encounter: Admit: 2019-05-20 | Discharge: 2019-05-20 | Payer: MEDICARE

## 2019-05-21 ENCOUNTER — Encounter: Admit: 2019-05-21 | Discharge: 2019-05-21 | Payer: MEDICARE

## 2019-05-23 MED ORDER — METOPROLOL SUCCINATE 100 MG PO TB24
100 mg | ORAL_TABLET | Freq: Every day | ORAL | 3 refills | 90.00000 days | Status: AC
Start: 2019-05-23 — End: ?

## 2019-05-23 MED ORDER — METOPROLOL SUCCINATE 50 MG PO TB24
ORAL_TABLET | Freq: Every day | 2 refills
Start: 2019-05-23 — End: ?

## 2019-07-14 ENCOUNTER — Encounter: Admit: 2019-07-14 | Discharge: 2019-07-14 | Payer: MEDICARE

## 2019-08-14 ENCOUNTER — Encounter: Admit: 2019-08-14 | Discharge: 2019-08-14 | Payer: MEDICARE

## 2019-08-15 MED ORDER — ELIQUIS 5 MG PO TAB
ORAL_TABLET | Freq: Two times a day (BID) | 3 refills | Status: AC
Start: 2019-08-15 — End: ?

## 2019-10-13 ENCOUNTER — Encounter: Admit: 2019-10-13 | Discharge: 2019-10-13 | Payer: MEDICARE

## 2019-11-10 ENCOUNTER — Encounter: Admit: 2019-11-10 | Discharge: 2019-11-10 | Payer: MEDICARE

## 2019-11-10 MED ORDER — ATORVASTATIN 20 MG PO TAB
ORAL_TABLET | Freq: Every day | 3 refills | Status: AC
Start: 2019-11-10 — End: ?

## 2019-11-10 MED ORDER — SOTALOL 120 MG PO TAB
ORAL_TABLET | Freq: Two times a day (BID) | ORAL | 3 refills | 30.00000 days | Status: AC
Start: 2019-11-10 — End: ?

## 2019-12-01 ENCOUNTER — Encounter: Admit: 2019-12-01 | Discharge: 2019-12-01 | Payer: MEDICARE

## 2019-12-01 ENCOUNTER — Ambulatory Visit: Admit: 2019-12-01 | Discharge: 2019-12-01 | Payer: MEDICARE

## 2019-12-01 DIAGNOSIS — I340 Nonrheumatic mitral (valve) insufficiency: Secondary | ICD-10-CM

## 2019-12-01 DIAGNOSIS — I469 Cardiac arrest, cause unspecified: Secondary | ICD-10-CM

## 2019-12-01 DIAGNOSIS — Z8674 Personal history of sudden cardiac arrest: Secondary | ICD-10-CM

## 2019-12-01 DIAGNOSIS — Z7901 Long term (current) use of anticoagulants: Secondary | ICD-10-CM

## 2019-12-01 DIAGNOSIS — I341 Nonrheumatic mitral (valve) prolapse: Secondary | ICD-10-CM

## 2019-12-01 DIAGNOSIS — I471 Supraventricular tachycardia: Secondary | ICD-10-CM

## 2019-12-01 DIAGNOSIS — I428 Other cardiomyopathies: Secondary | ICD-10-CM

## 2019-12-01 DIAGNOSIS — I480 Paroxysmal atrial fibrillation: Secondary | ICD-10-CM

## 2019-12-01 DIAGNOSIS — R0602 Shortness of breath: Secondary | ICD-10-CM

## 2019-12-01 DIAGNOSIS — I4891 Unspecified atrial fibrillation: Secondary | ICD-10-CM

## 2019-12-01 DIAGNOSIS — Z95810 Presence of automatic (implantable) cardiac defibrillator: Secondary | ICD-10-CM

## 2019-12-01 NOTE — Patient Instructions
please schedule an appointment with Dr. Pimentel in 1 year .  To schedule an appointment call [phone].     In order to provide you the best care possible we ask that you follow up as below:    For non-urgent questions please contact us through your MyChart account.   For all medication refills please contact your pharmacy or send a request through MyChart.     For all questions that may need to be addressed urgently please call the nursing triage voicemail at [phone] Monday - Friday 8-5 only. Please leave a detailed message with your name, date of birth, and reason for your call.      Please allow ~ 10 business days for the results of any testing to be reviewed. Please call our office if you have not heard from a nurse within this time frame.

## 2019-12-01 NOTE — Progress Notes
Date of Service: 12/01/2019    Tammy Heath is a 74 y.o. female.       HPI     Sister Tammy Heath presents to my office today in electrophysiology follow-up for history of atrial arrhythmias and defibrillator.  As you know, she is a pleasant 74 year old female with a past medical history of mitral valve prolapse with regurgitation status post mitral valve repair complicated by pericardial effusion and Dressler syndrome, ventricular fibrillation status post defibrillator implantation, and paroxysmal atrial fibrillation who was last seen in the office by me in August 2020.  At that time, the patient was being seen in hospital follow-up after recent ICD shocks due to atrial fibrillation with rapid ventricular rate.  She was placed on sotalol and her device was reprogrammed.  LV EF by echo was normal and her mitral valve was functioning normally.  She was feeling overall well with some vague dyspnea on exertion.    Tammy Heath is in my office today telling me that she has continued to do well.  Patient denies chest discomfort, shortness of breath, dizziness, or syncope.  She has had no palpitations.  Her biggest complaint to me today is that she does continue to have some dyspnea on exertion.  It is no worse than the last time I saw her.  However, she clearly states that she cannot walk up 2 flights of stairs without stopping in the middle.  She is fine on flat surfaces but also notices some dyspnea when walking up a hill.    Interrogation the patient's dual-chamber defibrillator shows stable sensing and pacing thresholds.  She has 75% atrially paced.  Patient has had no atrial arrhythmias or ICD shocks.         Vitals:    12/01/19 1034   BP: 130/84   BP Source: Arm, Left Upper   Patient Position: Sitting   Pulse: 62   SpO2: 98%   Weight: 72.6 kg (160 lb)   Height: 1.651 m (5' 5)   PainSc: Zero     Body mass index is 26.63 kg/m?Marland Kitchen     Past Medical History  Patient Active Problem List    Diagnosis Date Noted   ? SOB (shortness of breath) 12/01/2019   ? Chronic anticoagulationon apixaban 08/30/2018   ? ICD (implantable cardioverter-defibrillator) discharge 08/29/2018   ? Hyperlipidemia 08/29/2010   ? History of cardiac arrest      9/07  VF arrest with sudden cardiac death   -Dual chamber ICD implanted ([place], [place]), Bos Sci   -Cardiac cath:  Normal Cor's, EF 55%, MVP  6/10   VF arrest s/p successful ICD shocks, found to have severe MR with MVP     ? PAF (paroxysmal atrial fibrillation) (HCC)      7/10:  Post op atrial fibrillation   -Started on amiodarone       ? S/P MVR (mitral valve repair) 09/01/2008     08/17/08 ([facility]):  Right video-assisted thoracoscopy and robotic-assisted repair of mitral valve with triangular resection of P2 and implantation of 27-mm posterior annuloplasty band.  Repair of right atrial tear.  07/25/2015 - ECHO:       ? SVT (supraventricular tachycardia) (HCC) 08/22/2008   ? 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 Nonischemic cardiomyopathy (HCC) 08/04/2008     7/11 echo:  Normal chamber sizes other than mild right atrial enlargement   Normal ventricular systolic function   EF = 55%   Normal cardiac valve structure and function other than mild  TV regurgitation   Normal pulmonary artery pressure.   PAP =   No significant pericardial effusion     7/13 stress echo:  Normal LV systolic function   Mild LV diastolic dysfunction   Non-ischemic response to exercise   Normal post-mitral valvuloplasty appearance   Bi-atrial enlargement   Normal PA pressure    08/30/2018 - ECHO:  The left ventricular systolic function is normal, the visually estimated ejection fraction is 55%.  Grade I (mild) left ventricular diastolic dysfunction.  The right ventricle is mildly dilated with mildly reduced systolic function, ICD lead is present.  Mild biatrial dilatation.  There is severe mitral annular calcification without stenosis.  Trace mitral valve regurgitation, mild to moderate tricuspid valve regurgitation.  Estimated Peak Systolic PA Pressure 28 mmHg         ? Mitral regurgitation 08/04/2008     08/10/08 cardiac cath [facility]):  No significant angiographic coronary artery stenosis.  1. Preserved LV systolic function with EF 55 percent, though in the presence of severe mitral regurgitation. The EF is not hyperdynamic, thus likely represents some systolic dysfunction.  2. Moderate pulmonary artery hypertension.  3. Diminished cardiac output at 3 liters per minute by thermodilution and 3.4 liters per minute by Fick.  Severe grade 4 mitral regurgitation with severe mitral valve prolapse is noted.  08/17/08 ([facility])  Right video-assisted thoracoscopy and robotic-assisted repair of mitral valve with triangular resection of P2 and implantation of 27-mm posterior annuloplasty band. Repair of right atrial tear  09/03/08  Readmitted for pericardial effusion and tamponade (INR 10):  S/p drain       ? Mitral valve prolapse 08/04/2008     7/09 echo:  Left Ventricular Ejection Fraction: 55%  Normal Left Ventricular Wall Thickness  No Regional Wall Motion Abnormalities  Right ventricular systolic function appears normal  Normal Chamber Dimensions  Mitral Valve Prolapse, PASP 26 mm Hg, mild-moderate MR  Valve Structures Otherwise Unremarkable  No Effusions, Masses or Thrombi  Pacemaker Electrode In Right Atrium And Right Ventricle  Normal Inferior Vena Cava Consistent With Normal Central Venous Pressure           ? Automatic implantable cardioverter-defibrillator in situ 08/04/2008     7/10 cardiac cath:  FINAL IMPRESSION:   4. No significant angiographic coronary artery stenosis.  5. Preserved LV systolic function with EF 55 percent, though in the presence of severe mitral regurgitation. The EF is not hyperdynamic, thus likely represents some systolic dysfunction.  6. Moderate pulmonary artery hypertension.  7. Diminished cardiac output at 3 liters per minute by thermodilution and 3.4 liters per minute by Fick.  8. Severe grade 4 mitral regurgitation with severe mitral valve prolapse is noted.    7/13 stress echo:  Normal LV systolic function   Mild LV diastolic dysfunction   Non-ischemic response to exercise   Normal post-mitral valvuloplasty appearance   Bi-atrial enlargement   Normal PA pressure    04/16/12 ICD Generator Change w/RCP: [facility] INCEPTA ICD E163              Review of Systems   Constitutional: Negative.   HENT: Negative.    Eyes: Negative.    Cardiovascular: Negative.    Respiratory: Negative.    Endocrine: Negative.    Hematologic/Lymphatic: Negative.    Skin: Negative.    Musculoskeletal: Negative.    Gastrointestinal: Negative.    Genitourinary: Negative.    Neurological: Negative.    Psychiatric/Behavioral: Negative.    Allergic/Immunologic: Negative.  Physical Exam  General Appearance: well developed, well nourished in no acute distress  Skin: warm, moist, no ulcers  HEENT: extraocular movements intact, oropharynx clear  Neck Veins: neck veins are flat, neck veins are not distended  Carotid Arteries: normal carotid upstroke bilaterally, no bruits  Chest Inspection: chest is normal in appearance  Auscultation/Percussion: lungs clear to auscultation, no rales, rhonchi, or wheezing  Cardiac Rhythm: regular rhythm and normal rate  Cardiac Auscultation: Normal S1 & S2, no S3 or S4, no rub  Murmurs: no cardiac murmurs   Extremities: no lower extremity edema; 1+ symmetric distal pulses  Abdominal Exam: soft, non-tender, no masses, bowel sounds normal  Liver & Spleen: no organomegaly  Neurologic Exam: neurological assessment grossly intact      Cardiovascular Studies  12 lead EKG:  Sinus rhythm, ventricular rate 62 bpm, QTc 468 msec, QRSd 88 msec      Cardiovascular Health Factors  Vitals BP Readings from Last 3 Encounters:   12/01/19 130/84   09/16/18 126/86   08/31/18 106/62     Wt Readings from Last 3 Encounters:   12/01/19 72.6 kg (160 lb)   09/16/18 68.5 kg (151 lb)   08/31/18 68.6 kg (151 lb 3.2 oz)     BMI Readings from Last 3 Encounters: 12/01/19 26.63 kg/m?   09/16/18 25.13 kg/m?   08/31/18 25.16 kg/m?      Smoking Social History     Tobacco Use   Smoking Status Never Smoker   Smokeless Tobacco Never Used      Lipid Profile Cholesterol   Date Value Ref Range Status   05/11/2019 138  Final     HDL   Date Value Ref Range Status   05/11/2019 49  Final     LDL   Date Value Ref Range Status   05/11/2019 65  Final     Triglycerides   Date Value Ref Range Status   05/11/2019 121  Final      Blood Sugar Hemoglobin A1C   Date Value Ref Range Status   08/08/2008 5.6 4.0 - 6.0 % Final     Comment:     NOTE NEW REFERENCE RANGES  For patients with Type I or Type II Diabetes Mellitus, the ADA recommends   maintaining the A1c level <7%.     Glucose   Date Value Ref Range Status   05/11/2019 97  Final   08/31/2018 93 70 - 100 MG/DL Final   16/11/9602 540 (H) 70 - 100 MG/DL Final     Glucose, POC   Date Value Ref Range Status   08/24/2008 103 (H) 70 - 100 MG/DL Final   98/12/9145 829 (H) 70 - 100 MG/DL Final   56/21/3086 578 (H) 70 - 100 MG/DL Final          Problems Addressed Today  Encounter Diagnoses   Name Primary?   ? PAF (paroxysmal atrial fibrillation) (HCC) Yes   ? Nonischemic cardiomyopathy (HCC)    ? Automatic implantable cardioverter-defibrillator in situ    ? SOB (shortness of breath)        Assessment and Plan       PAF (paroxysmal atrial fibrillation) (HCC)  She has had no recent atrial fibrillation by device interrogation.  Tammy Heath is tolerating her current doses of sotalol and Eliquis without difficulty.  I would continue current medications.    Automatic implantable cardioverter-defibrillator in situ  Device interrogation today shows stable sensing and pacing thresholds.  She has had no atrial  arrhythmias.  Patient remains about 75% atrially paced.  I did not make any changes to her programming.    SOB (shortness of breath)  Patient is describing some shortness of breath on exertion.  She has to walk up 2 flights of stairs and needs to stop at the landing in between.  She relates that it is worse since being started on sotalol.  In looking at her chart, she is on both sotalol and metoprolol.  Her LV ejection fraction prior to initiating sotalol was normal at 55%.  Patient does not believe that her symptoms have worsened over the last year.    I suspect that her symptoms are secondary to beta-blocker use.  We did talk about the possibility of this being due to coronary artery disease.  I am somewhat reassured by her last echocardiogram that showed normal LV function.  I have asked her to contact me if her dyspnea worsens.  At that point, I would repeat stress testing.  Mrs. Tammy Heath is happy to continue her current doses of medications.    I have asked the patient to see me back in follow up in one year.      Current Medications (including today's revisions)  ? acetaminophen (TYLENOL) 325 mg tablet Take 650 mg by mouth every 4 hours as needed for Pain.   ? aspirin EC 81 mg tablet Take 81 mg by mouth daily.     ? atorvastatin (LIPITOR) 20 mg tablet TAKE 1 TABLET BY MOUTH EVERY DAY   ? cetirizine (ZYRTEC) 10 mg PO tablet Take 10 mg by mouth daily.   ? cholecalciferol (vitamin D3) (VITAMIN D3 PO) Take 1 tablet by mouth daily.   ? ELIQUIS 5 mg tablet TAKE 1 TABLET BY MOUTH TWICE A DAY   ? metoprolol XL (TOPROL XL) 100 mg extended release tablet Take one tablet by mouth daily.   ? other medication 1 Dose. Alkalete (acid reducer): Take 1 capsule daily as needed.   ? sotaloL (BETAPACE) 120 mg tab TAKE ONE TABLET BY MOUTH EVERY 12 HOURS.

## 2019-12-01 NOTE — Assessment & Plan Note
Patient is describing some shortness of breath on exertion.  She has to walk up 2 flights of stairs and needs to stop at the landing in between.  She relates that it is worse since being started on sotalol.  In looking at her chart, she is on both sotalol and metoprolol.  Her LV ejection fraction prior to initiating sotalol was normal at 55%.  Patient does not believe that her symptoms have worsened over the last year.    I suspect that her symptoms are secondary to beta-blocker use.  We did talk about the possibility of this being due to coronary artery disease.  I am somewhat reassured by her last echocardiogram that showed normal LV function.  I have asked her to contact me if her dyspnea worsens.  At that point, I would repeat stress testing.  Tammy Heath is happy to continue her current doses of medications.

## 2019-12-01 NOTE — Assessment & Plan Note
Device interrogation today shows stable sensing and pacing thresholds.  She has had no atrial arrhythmias.  Patient remains about 75% atrially paced.  I did not make any changes to her programming.

## 2019-12-01 NOTE — Assessment & Plan Note
She has had no recent atrial fibrillation by device interrogation.  Tammy Heath is tolerating her current doses of sotalol and Eliquis without difficulty.  I would continue current medications.

## 2020-01-12 ENCOUNTER — Encounter: Admit: 2020-01-12 | Discharge: 2020-01-12 | Payer: MEDICARE

## 2020-04-16 ENCOUNTER — Encounter: Admit: 2020-04-16 | Discharge: 2020-04-16 | Payer: MEDICARE

## 2020-06-04 ENCOUNTER — Encounter: Admit: 2020-06-04 | Discharge: 2020-06-04 | Payer: MEDICARE

## 2020-06-04 DIAGNOSIS — I428 Other cardiomyopathies: Secondary | ICD-10-CM

## 2020-06-04 DIAGNOSIS — E782 Mixed hyperlipidemia: Secondary | ICD-10-CM

## 2020-06-04 DIAGNOSIS — I480 Paroxysmal atrial fibrillation: Secondary | ICD-10-CM

## 2020-06-04 DIAGNOSIS — Z95810 Presence of automatic (implantable) cardiac defibrillator: Secondary | ICD-10-CM

## 2020-06-04 DIAGNOSIS — I471 Supraventricular tachycardia: Secondary | ICD-10-CM

## 2020-06-04 MED ORDER — METOPROLOL SUCCINATE 100 MG PO TB24
ORAL_TABLET | Freq: Every day | ORAL | 3 refills | 90.00000 days | Status: AC
Start: 2020-06-04 — End: ?

## 2020-06-04 NOTE — Telephone Encounter
-----   Message from Golda Acre, LPN sent at 09/27/5629 10:50 AM CDT -----  Regarding: RCP- appt ?  VM on triage line from patient at 10:14am.  She is scheduled for 12-05-20.  Does she need to have labs and testing done prior?  If so she would like to do them the same day.  Call her at #[phone].

## 2020-06-04 NOTE — Telephone Encounter
Called and spoke with patient. Let her know she needs labs done. No further concerns or questions.

## 2020-07-12 ENCOUNTER — Encounter: Admit: 2020-07-12 | Discharge: 2020-07-12 | Payer: MEDICARE

## 2020-09-06 ENCOUNTER — Encounter: Admit: 2020-09-06 | Discharge: 2020-09-06 | Payer: MEDICARE

## 2020-09-06 MED ORDER — SOTALOL 120 MG PO TAB
ORAL_TABLET | Freq: Two times a day (BID) | 3 refills
Start: 2020-09-06 — End: ?

## 2020-09-06 MED ORDER — ATORVASTATIN 20 MG PO TAB
ORAL_TABLET | Freq: Every day | 3 refills
Start: 2020-09-06 — End: ?

## 2020-09-06 MED ORDER — ELIQUIS 5 MG PO TAB
ORAL_TABLET | Freq: Two times a day (BID) | 3 refills
Start: 2020-09-06 — End: ?

## 2020-10-16 ENCOUNTER — Encounter: Admit: 2020-10-16 | Discharge: 2020-10-16 | Payer: MEDICARE

## 2020-10-16 DIAGNOSIS — I480 Paroxysmal atrial fibrillation: Secondary | ICD-10-CM

## 2020-10-16 DIAGNOSIS — Z95810 Presence of automatic (implantable) cardiac defibrillator: Secondary | ICD-10-CM

## 2020-10-16 DIAGNOSIS — E782 Mixed hyperlipidemia: Secondary | ICD-10-CM

## 2020-10-16 DIAGNOSIS — I428 Other cardiomyopathies: Secondary | ICD-10-CM

## 2020-10-16 DIAGNOSIS — I471 Supraventricular tachycardia: Secondary | ICD-10-CM

## 2020-10-16 LAB — TSH WITH FREE T4 REFLEX: TSH: 1.2

## 2020-10-18 ENCOUNTER — Encounter: Admit: 2020-10-18 | Discharge: 2020-10-18 | Payer: MEDICARE

## 2020-10-18 DIAGNOSIS — Z95810 Presence of automatic (implantable) cardiac defibrillator: Secondary | ICD-10-CM

## 2020-11-01 ENCOUNTER — Encounter: Admit: 2020-11-01 | Discharge: 2020-11-01 | Payer: MEDICARE

## 2020-11-02 ENCOUNTER — Encounter: Admit: 2020-11-02 | Discharge: 2020-11-02 | Payer: MEDICARE

## 2020-12-05 ENCOUNTER — Ambulatory Visit: Admit: 2020-12-05 | Discharge: 2020-12-05 | Payer: MEDICARE

## 2020-12-05 ENCOUNTER — Encounter: Admit: 2020-12-05 | Discharge: 2020-12-05 | Payer: MEDICARE

## 2020-12-05 DIAGNOSIS — R0989 Other specified symptoms and signs involving the circulatory and respiratory systems: Secondary | ICD-10-CM

## 2020-12-05 DIAGNOSIS — I469 Cardiac arrest, cause unspecified: Secondary | ICD-10-CM

## 2020-12-05 DIAGNOSIS — I428 Other cardiomyopathies: Secondary | ICD-10-CM

## 2020-12-05 DIAGNOSIS — R0602 Shortness of breath: Secondary | ICD-10-CM

## 2020-12-05 DIAGNOSIS — I340 Nonrheumatic mitral (valve) insufficiency: Secondary | ICD-10-CM

## 2020-12-05 DIAGNOSIS — I480 Paroxysmal atrial fibrillation: Secondary | ICD-10-CM

## 2020-12-05 DIAGNOSIS — Z7901 Long term (current) use of anticoagulants: Secondary | ICD-10-CM

## 2020-12-05 DIAGNOSIS — I471 Supraventricular tachycardia: Secondary | ICD-10-CM

## 2020-12-05 DIAGNOSIS — I4891 Unspecified atrial fibrillation: Secondary | ICD-10-CM

## 2020-12-05 DIAGNOSIS — Z8674 Personal history of sudden cardiac arrest: Secondary | ICD-10-CM

## 2020-12-05 DIAGNOSIS — I341 Nonrheumatic mitral (valve) prolapse: Secondary | ICD-10-CM

## 2020-12-05 DIAGNOSIS — Z95810 Presence of automatic (implantable) cardiac defibrillator: Secondary | ICD-10-CM

## 2020-12-05 NOTE — Patient Instructions
Please schedule an echo and stress test.    please schedule an appointment with Dr. Pimentel in 1 year .  To schedule an appointment call [phone].     In order to provide you the best care possible we ask that you follow up as below:    For non-urgent questions please contact us through your MyChart account.   For all medication refills please contact your pharmacy or send a request through MyChart.     For all questions that may need to be addressed urgently please call the nursing triage voicemail at [phone] Monday - Friday 8-5 only. Please leave a detailed message with your name, date of birth, and reason for your call.      Please allow ~ 10 business days for the results of any testing to be reviewed. Please call our office if you have not heard from a nurse within this time frame.

## 2020-12-05 NOTE — Assessment & Plan Note
Patient continues to report dyspnea when walking up 3 flights of stairs.  She does not think that it has changed significantly over the last year.  In looking back, we do not have a recent cardiovascular evaluation.  I have asked Tammy Heath to undergo echocardiogram to assess her mitral valve further as well as thallium stress testing.  She is agreeable.

## 2020-12-05 NOTE — Assessment & Plan Note
Device interrogation today shows stable sensing and pacing thresholds.  She has had no atrial or ventricular arrhythmias.  I did not make any changes to her programming.

## 2020-12-05 NOTE — Progress Notes
Date of Service: 12/05/2020    Tammy Heath is a 75 y.o. female.       HPI     Tammy Heath presents to my office today in electrophysiology follow-up for a history of atrial as well as ventricular arrhythmias and defibrillator.  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  At that time, the patient was doing well except for complaints of dyspnea on exertion.  We did make some changes to her rate response on her device.  We made plans to see each other back in follow-up in 1 year.    Tammy Heath returns to my office today, telling me that she has been doing well.  Patient denies chest discomfort, dizziness, syncope, or palpitations.  She did suffer from COVID 6 weeks ago which she described as a bad cold .  Her only complaint to me is continued dyspnea on exertion.  She is able to walk up 3 flights of stairs before she gets short of breath and has to stop.  At this point, her symptoms are somewhat stable and unchanged compared to last year.    Interrogation of the patient's dual-chamber defibrillator shows stable sensing and pacing thresholds.  She has had no atrial or ventricular arrhythmias.  She has a paced 82% the time and RV paced less than 1%.         Vitals:    12/05/20 0946   BP: (!) 140/82   BP Source: Arm, Left Upper   Pulse: 60   SpO2: 98%   O2 Device: None (Room air)   PainSc: Zero   Weight: 67.5 kg (148 lb 12.8 oz)   Height: 165.1 cm (5' 5)     Body mass index is 24.76 kg/m?Marland Kitchen     Past Medical History  Patient Active Problem List    Diagnosis Date Noted   ? SOB (shortness of breath) 12/01/2019   ? Chronic anticoagulationon apixaban 08/30/2018   ? ICD (implantable cardioverter-defibrillator) discharge 08/29/2018   ? Hyperlipidemia 08/29/2010   ? History of cardiac arrest      9/07  VF arrest with sudden cardiac death   -Dual chamber ICD implanted ([place], [place]), [employer]   -Cardiac cath:  Normal Cor's, EF 55%, MVP  6/10   VF arrest s/p successful ICD shocks, found to have severe MR with MVP     ? PAF (paroxysmal atrial fibrillation) (HCC)      7/10:  Post op atrial fibrillation   -Started on amiodarone       ? S/P MVR (mitral valve repair) 09/01/2008     08/17/08 ([facility]):  Right video-assisted thoracoscopy and robotic-assisted repair of mitral valve with triangular resection of P2 and implantation of 27-mm posterior annuloplasty band.  Repair of right atrial tear.  07/25/2015 - ECHO:       ? SVT (supraventricular tachycardia) (HCC) 08/22/2008   ? 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   PAP =    No significant pericardial effusion     7/13 stress echo:  Normal LV systolic function   Mild LV diastolic dysfunction   Non-ischemic response to exercise   Normal post-mitral valvuloplasty appearance   Bi-atrial enlargement   Normal PA pressure    08/30/2018 - ECHO:  The left ventricular systolic function is normal, the visually estimated ejection fraction is 55%.  Grade I (mild) left ventricular diastolic dysfunction.  The right ventricle is mildly dilated with mildly reduced systolic function, ICD lead is present.  Mild biatrial dilatation.  There is severe mitral annular calcification without stenosis.  Trace mitral valve regurgitation, mild to moderate tricuspid valve regurgitation.  Estimated Peak Systolic PA Pressure 28 mmHg         ? Mitral regurgitation 08/04/2008     08/10/08 cardiac cath [facility]):  No significant angiographic coronary artery stenosis.  1. Preserved LV systolic function with EF 55 percent, though in the presence of severe mitral regurgitation. The EF is not hyperdynamic, thus likely represents some systolic dysfunction.  2. Moderate pulmonary artery hypertension.  3. Diminished cardiac output at 3 liters per minute by thermodilution and 3.4 liters per minute by Fick.  Severe grade 4 mitral regurgitation with severe mitral valve prolapse is noted.  08/17/08 ([facility])  Right video-assisted thoracoscopy and robotic-assisted repair of mitral valve with triangular resection of P2 and implantation of 27-mm posterior annuloplasty band. Repair of right atrial tear  09/03/08  Readmitted for pericardial effusion and tamponade (INR 10):  S/p drain       ? Mitral valve prolapse 08/04/2008     7/09 echo:  Left Ventricular Ejection Fraction: 55%  Normal Left Ventricular Wall Thickness  No Regional Wall Motion Abnormalities  Right ventricular systolic function appears normal  Normal Chamber Dimensions  Mitral Valve Prolapse, PASP 26 mm Hg, mild-moderate MR  Valve Structures Otherwise Unremarkable  No Effusions, Masses or Thrombi  Pacemaker Electrode In Right Atrium And Right Ventricle  Normal Inferior Vena Cava Consistent With Normal Central Venous Pressure           ? Automatic implantable cardioverter-defibrillator in situ 08/04/2008     7/10 cardiac cath:  FINAL IMPRESSION:   4. No significant angiographic coronary artery stenosis.  5. Preserved LV systolic function with EF 55 percent, though in the presence of severe mitral regurgitation. The EF is not hyperdynamic, thus likely represents some systolic dysfunction.  6. Moderate pulmonary artery hypertension.  7. Diminished cardiac output at 3 liters per minute by thermodilution and 3.4 liters per minute by Fick.  8. Severe grade 4 mitral regurgitation with severe mitral valve prolapse is noted.    7/13 stress echo:  Normal LV systolic function   Mild LV diastolic dysfunction   Non-ischemic response to exercise   Normal post-mitral valvuloplasty appearance   Bi-atrial enlargement   Normal PA pressure    04/16/12 ICD Generator Change w/RCP: [facility] INCEPTA ICD E163              Review of Systems   Constitutional: Negative.   HENT: Negative.    Eyes: Negative.    Cardiovascular: Negative.    Respiratory: Negative.    Endocrine: Negative.    Hematologic/Lymphatic: Negative.    Skin: Negative.    Musculoskeletal: Negative.    Gastrointestinal: Negative.    Genitourinary: Negative.    Neurological: Negative.    Psychiatric/Behavioral: Negative.    Allergic/Immunologic: Negative.        Physical Exam  General Appearance: well developed,  well nourished in no acute distress  Skin: warm, moist, no ulcers  HEENT: extraocular movements intact, oropharynx clear  Neck Veins: neck veins are flat, neck veins are not distended  Carotid Arteries: normal carotid upstroke bilaterally, no bruits  Chest Inspection: chest is normal in appearance  Auscultation/Percussion: decreased breath sounds throughout, no rales, rhonchi, or wheezing  Cardiac Rhythm: regular rhythm and normal rate  Cardiac Auscultation: Normal S1 & S2, no S3 or S4, no rub  Murmurs: no cardiac murmurs   Extremities: no lower extremity edema; 1+ symmetric distal pulses  Abdominal Exam: soft, non-tender, no masses, bowel sounds normal  Liver & Spleen: no organomegaly  Neurologic Exam: neurological assessment grossly intact      Cardiovascular Studies  12 lead EKG:  A paced, ventricular sensed at 60 bpm, QTc 450 msec, QRSd 82 msec    Cardiovascular Health Factors  Vitals BP Readings from Last 3 Encounters:   12/05/20 (!) 140/82   12/01/19 130/84   09/16/18 126/86     Wt Readings from Last 3 Encounters:   12/05/20 67.5 kg (148 lb 12.8 oz)   12/01/19 72.6 kg (160 lb)   09/16/18 68.5 kg (151 lb)     BMI Readings from Last 3 Encounters:   12/05/20 24.76 kg/m?   12/01/19 26.63 kg/m?   09/16/18 25.13 kg/m?      Smoking Social History Tobacco Use   Smoking Status Never Smoker   Smokeless Tobacco Never Used      Lipid Profile Cholesterol   Date Value Ref Range Status   10/12/2020 129  Final     HDL   Date Value Ref Range Status   10/12/2020 45  Final     LDL   Date Value Ref Range Status   10/12/2020 62  Final     Triglycerides   Date Value Ref Range Status   10/12/2020 108  Final      Blood Sugar Hemoglobin A1C   Date Value Ref Range Status   08/08/2008 5.6 4.0 - 6.0 % Final     Comment:     NOTE NEW REFERENCE RANGES  For patients with Type I or Type II Diabetes Mellitus, the ADA recommends   maintaining the A1c level <7%.     Glucose   Date Value Ref Range Status   10/12/2020 99  Final   05/11/2019 97  Final   08/31/2018 93 70 - 100 MG/DL Final     Glucose, POC   Date Value Ref Range Status   08/24/2008 103 (H) 70 - 100 MG/DL Final   16/11/9602 540 (H) 70 - 100 MG/DL Final   98/12/9145 829 (H) 70 - 100 MG/DL Final          Problems Addressed Today  Encounter Diagnoses   Name Primary?   ? Automatic implantable cardioverter-defibrillator in situ Yes   ? Cardiovascular symptoms    ? Nonischemic cardiomyopathy (HCC)    ? SVT (supraventricular tachycardia) (HCC)    ? Nonrheumatic mitral valve regurgitation    ? PAF (paroxysmal atrial fibrillation) (HCC)    ? SOB (shortness of breath)        Assessment and Plan       Nonischemic cardiomyopathy [facility])  Patient continues to report dyspnea when walking up 3 flights of stairs.  She does not think that it has changed significantly over the last year.  In looking back, we do not have a recent cardiovascular evaluation.  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  She is agreeable.    Mitral regurgitation  Patient has a history of mitral regurgitation status post repair.  Her last echocardiogram in 2020 showed appropriate function.  I have asked her to repeat another echocardiogram.    PAF (paroxysmal atrial fibrillation) (HCC)  Patient remains on anticoagulation for history of atrial fibrillation.  She reports no recent palpitations or falls.  We will continue current medications including sotalol.    Automatic implantable cardioverter-defibrillator in situ  Device interrogation today shows stable sensing and pacing thresholds.  She has had no atrial or ventricular arrhythmias.  I did not make any changes to her programming.    SOB (shortness of breath)  She continues to complain of shortness of breath on exertion.  Is unchanged compared to last year.  Unfortunately, it did not improve with changes in her device programming.  We are going to have her undergo echocardiogram as well as thallium stress testing.  It may be that her symptoms are secondary to an underlying pulmonary process as she is on metoprolol as well as sotalol.    I have asked the patient to see me back in follow up in one year.      Current Medications (including today's revisions)  ? acetaminophen (TYLENOL) 325 mg tablet Take 650 mg by mouth every 4 hours as needed for Pain.   ? alendronate (FOSAMAX) 70 mg tablet Take 70 mg by mouth every 7 days. Take at least 30 minutes before breakfast with plain water. Do not lie down for 30 minutes.   ? aspirin EC 81 mg tablet Take 81 mg by mouth daily.     ? atorvastatin (LIPITOR) 20 mg tablet TAKE 1 TABLET BY MOUTH EVERY DAY   ? CALCIUM PO Take 600 mg by mouth twice daily.   ? cetirizine (ZYRTEC) 10 mg PO tablet Take 10 mg by mouth daily.   ? cholecalciferol (vitamin D3) (OPTIMAL D3) 50,000 units capsule Take 50,000 Units by mouth every 7 days.   ? cholecalciferol (vitamin D3) (VITAMIN D3 PO) Take 1 tablet by mouth daily.   ? ELIQUIS 5 mg tablet TAKE 1 TABLET BY MOUTH TWICE A DAY   ? metoprolol XL (TOPROL XL) 100 mg extended release tablet TAKE 1 TABLET BY MOUTH EVERY DAY   ? other medication 1 Dose. Alkalete (acid reducer): Take 1 capsule daily as needed.   ? sotaloL (BETAPACE) 120 mg tab TAKE ONE TABLET BY MOUTH EVERY 12 HOURS.

## 2020-12-05 NOTE — Assessment & Plan Note
Patient has a history of mitral regurgitation status post repair.  Her last echocardiogram in 2020 showed appropriate function.  I have asked her to repeat another echocardiogram.

## 2020-12-05 NOTE — Assessment & Plan Note
Patient remains on anticoagulation for history of atrial fibrillation.  She reports no recent palpitations or falls.  We will continue current medications including sotalol.

## 2020-12-05 NOTE — Assessment & Plan Note
She continues to complain of shortness of breath on exertion.  Is unchanged compared to last year.  Unfortunately, it did not improve with changes in her device programming.  We are going to have her undergo echocardiogram as well as thallium stress testing.  It may be that her symptoms are secondary to an underlying pulmonary process as she is on metoprolol as well as sotalol.

## 2020-12-16 ENCOUNTER — Encounter: Admit: 2020-12-16 | Discharge: 2020-12-16 | Payer: MEDICARE

## 2020-12-16 MED ORDER — ELIQUIS 5 MG PO TAB
ORAL_TABLET | Freq: Two times a day (BID) | 0 refills
Start: 2020-12-16 — End: ?

## 2021-01-09 ENCOUNTER — Encounter: Admit: 2021-01-09 | Discharge: 2021-01-09 | Payer: MEDICARE

## 2021-01-10 ENCOUNTER — Encounter: Admit: 2021-01-10 | Discharge: 2021-01-10 | Payer: MEDICARE

## 2021-01-14 ENCOUNTER — Encounter: Admit: 2021-01-14 | Discharge: 2021-01-14 | Payer: MEDICARE

## 2021-01-14 NOTE — Telephone Encounter
Reviewed time/location of testing, npo status, no caffeine for 24 hours, may take medications with water, two parts to this study.  All other questions answered.

## 2021-01-15 ENCOUNTER — Encounter: Admit: 2021-01-15 | Discharge: 2021-01-15 | Payer: MEDICARE

## 2021-01-15 NOTE — Telephone Encounter
Sent Dr. Bradly Bienenstock this note:    Hi! I have a question for you- your patient, Celene Pippins UJ#[id number], is scheduled tomorrow afternoon for a treadmill nuclear stress test. Our treadmill's will randomly stop working so they are trying to get them fixed but in the mean time, we are unable to do treadmill stresses- would it be ok to switch him to a regadenoson stress test instead? Thanks.    Her response was:  Yes thats fine.       I then called the patient to verify if she was ok to switch her test from a treadmill nuclear stress test to a chemical stress test and she stated that she was fine with that.

## 2021-01-16 ENCOUNTER — Encounter: Admit: 2021-01-16 | Discharge: 2021-01-16 | Payer: MEDICARE

## 2021-01-16 ENCOUNTER — Ambulatory Visit: Admit: 2021-01-16 | Discharge: 2021-01-16 | Payer: MEDICARE

## 2021-01-16 DIAGNOSIS — I428 Other cardiomyopathies: Secondary | ICD-10-CM

## 2021-01-16 DIAGNOSIS — I480 Paroxysmal atrial fibrillation: Secondary | ICD-10-CM

## 2021-01-16 MED ORDER — REGADENOSON 0.4 MG/5 ML IV SYRG
.4 mg | Freq: Once | INTRAVENOUS | 0 refills | Status: CP
Start: 2021-01-16 — End: ?
  Administered 2021-01-16: 19:00:00 0.4 mg via INTRAVENOUS

## 2021-01-16 MED ORDER — SODIUM CHLORIDE 0.9 % IV SOLP
250 mL | INTRAVENOUS | 0 refills | Status: AC | PRN
Start: 2021-01-16 — End: ?

## 2021-01-16 MED ORDER — NITROGLYCERIN 0.4 MG SL SUBL
.4 mg | SUBLINGUAL | 0 refills | Status: AC | PRN
Start: 2021-01-16 — End: ?

## 2021-01-16 MED ORDER — EUCALYPTUS-MENTHOL MM LOZG
1 | Freq: Once | ORAL | 0 refills | Status: AC | PRN
Start: 2021-01-16 — End: ?

## 2021-01-16 MED ORDER — ALBUTEROL SULFATE 90 MCG/ACTUATION IN HFAA
2 | RESPIRATORY_TRACT | 0 refills | Status: AC | PRN
Start: 2021-01-16 — End: ?

## 2021-01-16 MED ORDER — RP DX TC-99M TETROFOSMIN MCI
8.8 | Freq: Once | INTRAVENOUS | 0 refills | Status: CP
Start: 2021-01-16 — End: ?
  Administered 2021-01-16: 19:00:00 8.8 via INTRAVENOUS

## 2021-01-16 MED ORDER — AMINOPHYLLINE 500 MG/20 ML IV SOLN
50 mg | INTRAVENOUS | 0 refills | Status: AC | PRN
Start: 2021-01-16 — End: ?

## 2021-01-16 MED ORDER — RP DX TC-99M TETROFOSMIN MCI
26.4 | Freq: Once | INTRAVENOUS | 0 refills | Status: CP
Start: 2021-01-16 — End: ?
  Administered 2021-01-16: 19:00:00 26.4 via INTRAVENOUS

## 2021-01-17 ENCOUNTER — Encounter: Admit: 2021-01-17 | Discharge: 2021-01-17 | Payer: MEDICARE

## 2021-01-17 NOTE — Telephone Encounter
01/17/2021 1:46 PM     mychart message sent to pt with results of echo & stress test as reviewed by RCP below

## 2021-02-01 ENCOUNTER — Encounter: Admit: 2021-02-01 | Discharge: 2021-02-01 | Payer: MEDICARE

## 2021-02-01 MED ORDER — SOTALOL 120 MG PO TAB
ORAL_TABLET | Freq: Two times a day (BID) | ORAL | 3 refills | 30.00000 days | Status: AC
Start: 2021-02-01 — End: ?

## 2021-02-01 MED ORDER — ATORVASTATIN 20 MG PO TAB
ORAL_TABLET | Freq: Every day | 3 refills | Status: AC
Start: 2021-02-01 — End: ?

## 2021-04-11 ENCOUNTER — Encounter: Admit: 2021-04-11 | Discharge: 2021-04-11 | Payer: MEDICARE

## 2021-05-10 ENCOUNTER — Encounter: Admit: 2021-05-10 | Discharge: 2021-05-10 | Payer: MEDICARE

## 2021-05-10 MED ORDER — METOPROLOL SUCCINATE 100 MG PO TB24
ORAL_TABLET | ORAL | 3 refills | 90.00000 days | Status: AC
Start: 2021-05-10 — End: ?

## 2021-06-13 ENCOUNTER — Encounter: Admit: 2021-06-13 | Discharge: 2021-06-13 | Payer: MEDICARE

## 2021-06-13 ENCOUNTER — Ambulatory Visit: Admit: 2021-06-13 | Discharge: 2021-06-13 | Payer: MEDICARE

## 2021-06-13 DIAGNOSIS — I428 Other cardiomyopathies: Secondary | ICD-10-CM

## 2021-07-16 ENCOUNTER — Encounter: Admit: 2021-07-16 | Discharge: 2021-07-16 | Payer: MEDICARE

## 2021-10-12 ENCOUNTER — Encounter: Admit: 2021-10-12 | Discharge: 2021-10-12 | Payer: MEDICARE

## 2021-10-12 MED ORDER — ELIQUIS 5 MG PO TAB
ORAL_TABLET | 3 refills
Start: 2021-10-12 — End: ?

## 2021-10-17 ENCOUNTER — Encounter: Admit: 2021-10-17 | Discharge: 2021-10-17 | Payer: MEDICARE

## 2021-10-17 DIAGNOSIS — I480 Paroxysmal atrial fibrillation: Secondary | ICD-10-CM

## 2021-10-31 ENCOUNTER — Encounter: Admit: 2021-10-31 | Discharge: 2021-10-31 | Payer: MEDICARE

## 2021-12-03 ENCOUNTER — Encounter: Admit: 2021-12-03 | Discharge: 2021-12-03 | Payer: MEDICARE

## 2021-12-03 MED ORDER — ATORVASTATIN 20 MG PO TAB
ORAL_TABLET | 3 refills
Start: 2021-12-03 — End: ?

## 2021-12-03 MED ORDER — SOTALOL 120 MG PO TAB
ORAL_TABLET | 3 refills
Start: 2021-12-03 — End: ?

## 2021-12-04 ENCOUNTER — Encounter: Admit: 2021-12-04 | Discharge: 2021-12-04 | Payer: MEDICARE

## 2021-12-04 DIAGNOSIS — I471 SVT (supraventricular tachycardia): Secondary | ICD-10-CM

## 2021-12-04 DIAGNOSIS — Z4502 Encounter for adjustment and management of automatic implantable cardiac defibrillator: Secondary | ICD-10-CM

## 2021-12-04 DIAGNOSIS — I480 Paroxysmal atrial fibrillation: Secondary | ICD-10-CM

## 2021-12-04 DIAGNOSIS — I4581 Long QT syndrome: Secondary | ICD-10-CM

## 2021-12-04 LAB — CBC
HEMATOCRIT: 47 K/UL — ABNORMAL HIGH (ref 1.8–7.0)
HEMOGLOBIN: 15 % (ref 0–2)
MCH: 30 K/UL (ref 0–0.80)
MCHC: 31 K/UL — ABNORMAL HIGH (ref 0–0.45)
MCV: 95 K/UL — ABNORMAL HIGH (ref 1.0–4.8)
MPV: 10
PLATELET COUNT: 201 K/UL (ref 0–0.20)
RBC COUNT: 4.9 % (ref 0–5)
RDW: 14
WBC COUNT: 7.4 % (ref 4–12)

## 2021-12-04 LAB — BASIC METABOLIC PANEL
BLD UREA NITROGEN: 24 — ABNORMAL HIGH
CALCIUM: 9.9
CHLORIDE: 105
CO2: 27
CREATININE: 0.9
GFR ESTIMATED: 60
GLUCOSE,PANEL: 95
POTASSIUM: 4.6
SODIUM: 140

## 2022-01-08 ENCOUNTER — Encounter: Admit: 2022-01-08 | Discharge: 2022-01-08 | Payer: MEDICARE

## 2022-01-08 MED ORDER — ELIQUIS 5 MG PO TAB
ORAL_TABLET | 0 refills | Status: AC
Start: 2022-01-08 — End: ?

## 2022-01-09 ENCOUNTER — Encounter: Admit: 2022-01-09 | Discharge: 2022-01-09 | Payer: MEDICARE

## 2022-04-02 ENCOUNTER — Ambulatory Visit: Admit: 2022-04-02 | Discharge: 2022-04-02 | Payer: MEDICARE

## 2022-04-02 ENCOUNTER — Encounter: Admit: 2022-04-02 | Discharge: 2022-04-02 | Payer: MEDICARE

## 2022-04-02 DIAGNOSIS — Z8674 Personal history of sudden cardiac arrest: Secondary | ICD-10-CM

## 2022-04-02 DIAGNOSIS — I469 Cardiac arrest, cause unspecified: Secondary | ICD-10-CM

## 2022-04-02 DIAGNOSIS — Z4502 Encounter for adjustment and management of automatic implantable cardiac defibrillator: Secondary | ICD-10-CM

## 2022-04-02 DIAGNOSIS — Z95810 Presence of automatic (implantable) cardiac defibrillator: Secondary | ICD-10-CM

## 2022-04-02 DIAGNOSIS — R0989 Other specified symptoms and signs involving the circulatory and respiratory systems: Secondary | ICD-10-CM

## 2022-04-02 DIAGNOSIS — I428 Other cardiomyopathies: Secondary | ICD-10-CM

## 2022-04-02 DIAGNOSIS — I471 SVT (supraventricular tachycardia): Secondary | ICD-10-CM

## 2022-04-02 DIAGNOSIS — I480 Paroxysmal atrial fibrillation: Secondary | ICD-10-CM

## 2022-04-02 DIAGNOSIS — I4891 Unspecified atrial fibrillation: Secondary | ICD-10-CM

## 2022-04-02 DIAGNOSIS — Z7901 Long term (current) use of anticoagulants: Secondary | ICD-10-CM

## 2022-04-02 DIAGNOSIS — I341 Nonrheumatic mitral (valve) prolapse: Secondary | ICD-10-CM

## 2022-04-02 DIAGNOSIS — I4901 Ventricular fibrillation: Secondary | ICD-10-CM

## 2022-04-02 DIAGNOSIS — I340 Nonrheumatic mitral (valve) insufficiency: Secondary | ICD-10-CM

## 2022-04-02 NOTE — Patient Instructions
Follow-Up:    -Thank you for allowing me to take care of you today. My name is Everette Rank, RN.    -We would like you to follow up in   1 years with Dr. Prudencio Burly C.Pimentel   The schedule is released approximately 4-5 months in advance. You should be called or mailed to make an appointment, however if you would like to call us to make this appt, please call [phone].    -You will receive a survey in the upcoming week from [facility] [place] [facility] Your feedback is important to [place], and helps [place] continue to improve patient care and patient satisfaction.     Contacting our office:    -For NON-URGENT questions please contact us through your MyChart account.   -For all medication refills please contact your pharmacy or send a request through MyChart.     -For all questions that may need to be addressed urgently please call the nursing triage line at [phone] Monday - Friday 8-5 only. Please leave a detailed message with your name, date of birth, and reason for your call.  As long as you call before 3:30pm, you will receive a call back the same day. Please allow time for [place] to review your chart prior to call back.     -Our fax number is [phone].    -Should you have an urgent concern over the weekend/nights that you do not believe warrants a visit to the emergency room, please contact [phone] for our on-call team.    Results & Testing Follow Up:    -Please allow 10-15 business days for the results of any testing to be reviewed. Please call our office if you have not heard from a nurse within this time frame.    -Should you choose to complete testing at an outside facility, please contact our office after completion of testing so that we can ensure that we have received results.    Lab and test results:  As a part of the CARES act, starting 05/12/2019, some results will be released to you via mychart immediately and automatically.  You may see results before your provider sees them; however, your provider will review all these results and then they, or one of their team, will notify you of result information and recommendations.   Critical results will be addressed immediately, but otherwise, please allow [place] time to get back with you prior to you reaching out to [place] for questions.  This will usually take about 72 hours for labs and 5-7 days for procedure test results.      We know you have a choice and want to thank you for choosing [facility].

## 2022-04-02 NOTE — Assessment & Plan Note
Patient has done well over the last year without complaints of chest discomfort, shortness of breath, dizziness, or syncope.  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  Her last echocardiogram about a year and a half ago showed normal LV function without regional wall motion abnormalities.  Mitral valve function also appeared to be reasonably normal.    She does complain of some nonspecific dyspnea on exertion but it is no different from the last 10 years.  We are going to simply monitor this for now.

## 2022-04-02 NOTE — Progress Notes
Date of Service: 04/02/2022    Tammy Heath is a 77 y.o. female.       HPI   Tammy Heath presents to my office today in electrophysiology follow-up for history of atrial and ventricular arrhythmias as well as defibrillator.  You know, she is a pleasant 77 year old female with a past medical history of mitral valve prolapse with regurgitation status post mitral valve repair complicated by pericardial effusion, Dressler syndrome, ventricular fibrillation status post defibrillator implantation, and paroxysmal atrial fibrillation who was last seen in the office by me about a year and a half ago.  At that time, she had just suffered from a bout of COVID.  She was slowly recovering.    Tammy Heath returns to my office today, telling me that she has been doing very well.  Patient continues to deny chest discomfort, shortness of breath, dizziness, or syncope.  She has had no palpitations.  She has chronic shortness of breath on exertion which she has complained about for many years.  Additionally, she has noted some occasional racing sensations of her heart lasting just a few seconds before they go away on their own.  These are again unchanged in frequency and duration.    Interrogation of the patient's dual-chamber defibrillator shows stable sensing and pacing thresholds.  She has approximately 1.5 years of battery life remaining.  Patient is atrially paced 81% of the time and RV paced 1% of the time.  There are no significant atrial or ventricular arrhythmias present.         Vitals:    04/02/22 1426   BP: 110/60   BP Source: Arm, Right Upper   Pulse: 78   SpO2: 98%   O2 Device: None (Room air)   Weight: 64.4 kg (142 lb)   Height: 165.1 cm (5' 5)     Body mass index is 23.63 kg/m?Marland Kitchen     Past Medical History  Patient Active Problem List    Diagnosis Date Noted    SOB (shortness of breath) 12/01/2019    Chronic anticoagulationon apixaban 08/30/2018    ICD (implantable cardioverter-defibrillator) discharge 08/29/2018 Hyperlipidemia 08/29/2010    History of cardiac arrest      9/07  VF arrest with sudden cardiac death   -Dual chamber ICD implanted ([place], [place]), [facility]   -Cardiac cath:  Normal Cor's, EF 55%, MVP  6/10   VF arrest s/p successful ICD shocks, found to have severe MR with MVP      PAF (paroxysmal atrial fibrillation) (HCC)      7/10:  Post op atrial fibrillation   -Started on amiodarone        S/P MVR (mitral valve repair) 09/01/2008     08/17/08 ([facility]):  Right video-assisted thoracoscopy and robotic-assisted repair of mitral valve with triangular resection of P2 and implantation of 27-mm posterior annuloplasty band.  Repair of right atrial tear.  07/25/2015 - ECHO:        SVT (supraventricular tachycardia) 08/22/2008    Long QT syndrome 08/22/2008    Nonischemic cardiomyopathy (HCC) 08/04/2008     7/11 echo:  Normal chamber sizes other than mild right atrial enlargement   Normal ventricular systolic function   EF = 55%   Normal cardiac valve structure and function other than mild TV regurgitation   Normal pulmonary artery pressure.   PAP =   No significant pericardial effusion     7/13 stress echo:  Normal LV systolic function   Mild LV diastolic dysfunction  Non-ischemic response to exercise   Normal post-mitral valvuloplasty appearance   Bi-atrial enlargement   Normal PA pressure    08/30/2018 - ECHO:  The left ventricular systolic function is normal, the visually estimated ejection fraction is 55%.  Grade I (mild) left ventricular diastolic dysfunction.  The right ventricle is mildly dilated with mildly reduced systolic function, ICD lead is present.  Mild biatrial dilatation.  There is severe mitral annular calcification without stenosis.  Trace mitral valve regurgitation, mild to moderate tricuspid valve regurgitation.  Estimated Peak Systolic PA Pressure 28 mmHg  16/11/9602 - ECHO:  No regional wall motion abnormalities are seen. Overall left ventricular systolic function appears normal. The estimated left ventricular ejection fraction is 55-60%. Left ventricular contractility appears similar when compared with the prior echocardiogram performed on 08/30/2018.   Normal left ventricular diastolic function.   Right ventricular chamber dimensions and contractility appear normal.  Mild right atrial enlargement.  Prior mitral valve repair is noted.  Mild mitral valve regurgitation without mitral valve stenosis is noted by Doppler exam.  Moderate to severe tricuspid valve regurgitation is noted by Doppler exam.  Aortic valve sclerosis without stenosis.  No pericardial effusion is seen.  The estimated peak systolic PA pressure = 30 mmHg.  01/16/2021 - [facility]aden[facility]on MPI:  This study is probably normal.  There is reduced tracer activity noted in the mid to distal anterolateral wall.  This is seen in the setting of breast attenuation artifact.  There is no corresponding wall motion or thickening abnormalities in this area.  Otherwise there are no definite fixed or reversible perfusion abnormalities present.  Global left ventricular systolic function is normal.  High risk adverse prognostic features are absent.  There are no prior studies available for direct comparison.  In aggregate the current study is low risk in regards to predicted annual cardiovascular mortality rate.      Mitral regurgitation 08/04/2008     08/10/08 cardiac cath [facility]):  No significant angiographic coronary artery stenosis.  Preserved LV systolic function with EF 55 percent, though in the presence of severe mitral regurgitation. The EF is not hyperdynamic, thus likely represents some systolic dysfunction.  Moderate pulmonary artery hypertension.  Diminished cardiac output at 3 liters per minute by thermodilution and 3.4 liters per minute by Fick.  Severe grade 4 mitral regurgitation with severe mitral valve prolapse is noted.  08/17/08 ([facility])  Right video-assisted thoracoscopy and robotic-assisted repair of mitral valve with triangular resection of P2 and implantation of 27-mm posterior annuloplasty band. Repair of right atrial tear  09/03/08  Readmitted for pericardial effusion and tamponade (INR 10):  S/p drain        Mitral valve prolapse 08/04/2008     7/09 echo:  Left Ventricular Ejection Fraction: 55%  Normal Left Ventricular Wall Thickness  No Regional Wall Motion Abnormalities  Right ventricular systolic function appears normal  Normal Chamber Dimensions  Mitral Valve Prolapse, PASP 26 mm Hg, mild-moderate MR  Valve Structures Otherwise Unremarkable  No Effusions, Masses or Thrombi  Pacemaker Electrode In Right Atrium And Right Ventricle  Normal Inferior Vena Cava Consistent With Normal Central Venous Pressure            Automatic implantable cardioverter-defibrillator in situ 08/04/2008     7/10 cardiac cath:  FINAL IMPRESSION:   No significant angiographic coronary artery stenosis.  Preserved LV systolic function with EF 55 percent, though in the presence of severe mitral regurgitation. The EF is not hyperdynamic, thus likely represents some systolic dysfunction.  Moderate pulmonary artery hypertension.  Diminished cardiac output at 3 liters per minute by thermodilution and 3.4 liters per minute by Fick.  Severe grade 4 mitral regurgitation with severe mitral valve prolapse is noted.    7/13 stress echo:  Normal LV systolic function   Mild LV diastolic dysfunction   Non-ischemic response to exercise   Normal post-mitral valvuloplasty appearance   Bi-atrial enlargement   Normal PA pressure    04/16/12 ICD Generator Change w/RCP: [facility] INCEPTA ICD E163              Review of Systems   Constitutional: Negative.   HENT: Negative.     Eyes: Negative.    Cardiovascular: Negative.    Respiratory: Negative.     Endocrine: Negative.    Hematologic/Lymphatic: Negative.    Skin: Negative.    Musculoskeletal: Negative.    Gastrointestinal: Negative.    Genitourinary: Negative.    Neurological: Negative.    Psychiatric/Behavioral: Negative.     Allergic/Immunologic: Negative.        Physical Exam  General Appearance: well developed, well nourished in no acute distress  Skin: warm, moist, no ulcers  HEENT: extraocular movements intact, oropharynx clear  Neck Veins: neck veins are flat, neck veins are not distended  Carotid Arteries: normal carotid upstroke bilaterally, no bruits  Chest Inspection: chest is normal in appearance  Auscultation/Percussion: lungs clear to auscultation, no rales, rhonchi, or wheezing  Cardiac Rhythm: regular rhythm and normal rate  Cardiac Auscultation: Normal S1 & S2, no S3 or S4, no rub  Murmurs: no cardiac murmurs   Extremities: no lower extremity edema; 1+ symmetric distal pulses  Abdominal Exam: soft, non-tender, no masses, bowel sounds normal  Liver & Spleen: no organomegaly  Neurologic Exam: neurological assessment grossly intact      Cardiovascular Studies  12 lead EKG:  Atrial paced rhythm, ventricular rate 60 bpm, QTc 444 msec, PRWP across precordium      Cardiovascular Health Factors  Vitals BP Readings from Last 3 Encounters:   04/02/22 110/60   01/16/21 (!) 144/81   12/05/20 (!) 140/82     Wt Readings from Last 3 Encounters:   04/02/22 64.4 kg (142 lb)   01/16/21 68 kg (150 lb)   12/05/20 67.5 kg (148 lb 12.8 oz)     BMI Readings from Last 3 Encounters:   04/02/22 23.63 kg/m?   01/16/21 24.96 kg/m?   12/05/20 24.76 kg/m?      Smoking Social History     Tobacco Use   Smoking Status Never   Smokeless Tobacco Never      Lipid Profile Cholesterol   Date Value Ref Range Status   11/07/2021 148  Final     HDL   Date Value Ref Range Status   11/07/2021 59  Final     LDL   Date Value Ref Range Status   10/12/2020 62  Final     Triglycerides   Date Value Ref Range Status   11/07/2021 92  Final      Blood Sugar Hemoglobin A1C   Date Value Ref Range Status   08/08/2008 5.6 4.0 - 6.0 % Final     Comment:     NOTE NEW REFERENCE RANGES  For patients with Type I or Type II Diabetes Mellitus, the ADA recommends   maintaining the A1c level <7%. Glucose   Date Value Ref Range Status   11/07/2021 95  Final   10/12/2020 99  Final   05/11/2019 97  Final     Glucose, POC   Date Value Ref Range Status   08/24/2008 103 (H) 70 - 100 MG/DL Final   84/69/6295 284 (H) 70 - 100 MG/DL Final   13/24/4010 272 (H) 70 - 100 MG/DL Final          Problems Addressed Today  Encounter Diagnoses   Name Primary?    Automatic implantable cardioverter-defibrillator in situ Yes    Cardiovascular symptoms     SVT (supraventricular tachycardia)     PAF (paroxysmal atrial fibrillation) (HCC)     Nonischemic cardiomyopathy (HCC)     Ventricular fibrillation (HCC)        Assessment and Plan       Automatic implantable cardioverter-defibrillator in situ  Device interrogation today shows stable sensing and pacing thresholds.  She has 1.5 years of battery life remaining.  Patient is atrially paced 81% of the time and V paced 1% of the time.  There have been no significant atrial or ventricular arrhythmias.  I did not make any changes to her programming.  We did talk about timing of her generator change.  I reassured her that once her device hit ERI, we would still have 3 months to replace her battery.  She seemed reassured by this.    Nonischemic cardiomyopathy (HCC)  Patient has done well over the last year without complaints of chest discomfort, shortness of breath, dizziness, or syncope.  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  Her last echocardiogram about a year and a half ago showed normal LV function without regional wall motion abnormalities.  Mitral valve function also appeared to be reasonably normal.    She does complain of some nonspecific dyspnea on exertion but it is no different from the last 10 years.  We are going to simply monitor this for now.    PAF (paroxysmal atrial fibrillation) (HCC)  She is complaining of some transient racing sensations that lasts just a few seconds and go away on their own.  It is not associated with exertion.  The episodes have not become longer or more frequent.  Patient remains on sotalol.  We will continue current medications.    I have asked the patient to see me back in follow up in one year.        Current Medications (including today's revisions)   acetaminophen (TYLENOL) 325 mg tablet Take 650 mg by mouth every 4 hours as needed for Pain.    alendronate (FOSAMAX) 70 mg tablet Take 70 mg by mouth every 7 days. Take at least 30 minutes before breakfast with plain water. Do not lie down for 30 minutes.    aspirin EC 81 mg tablet Take 81 mg by mouth daily.      atorvastatin (LIPITOR) 20 mg tablet TAKE 1 TABLET BY MOUTH EVERY DAY    CALCIUM PO Take 600 mg by mouth twice daily.    cetirizine (ZYRTEC) 10 mg PO tablet Take 10 mg by mouth daily.    cholecalciferol (vitamin D3) (OPTIMAL D3) 50,000 units capsule Take 50,000 Units by mouth every 7 days.    ELIQUIS 5 mg tablet TAKE 1 TABLET BY MOUTH TWICE A DAY    metoprolol succinate XL (TOPROL XL) 100 mg extended release tablet TAKE 1 TABLET BY MOUTH EVERY DAY    other medication 1 Dose. Alkalete (acid reducer): Take 1 capsule daily as needed.    sotaloL (BETAPACE) 120 mg tablet TAKE 1 TABLET  BY MOUTH EVERY 12 HOURS

## 2022-04-02 NOTE — Assessment & Plan Note
She is complaining of some transient racing sensations that lasts just a few seconds and go away on their own.  It is not associated with exertion.  The episodes have not become longer or more frequent.  Patient remains on sotalol.  We will continue current medications.

## 2022-04-02 NOTE — Assessment & Plan Note
Device interrogation today shows stable sensing and pacing thresholds.  She has 1.5 years of battery life remaining.  Patient is atrially paced 81% of the time and V paced 1% of the time.  There have been no significant atrial or ventricular arrhythmias.  I did not make any changes to her programming.  We did talk about timing of her generator change.  I reassured her that once her device hit ERI, we would still have 3 months to replace her battery.  She seemed reassured by this.

## 2022-04-12 ENCOUNTER — Encounter: Admit: 2022-04-12 | Discharge: 2022-04-12 | Payer: MEDICARE

## 2022-05-14 ENCOUNTER — Encounter: Admit: 2022-05-14 | Discharge: 2022-05-14 | Payer: MEDICARE

## 2022-05-14 MED ORDER — ELIQUIS 5 MG PO TAB
ORAL_TABLET | 0 refills | Status: AC
Start: 2022-05-14 — End: ?

## 2022-05-24 ENCOUNTER — Encounter: Admit: 2022-05-24 | Discharge: 2022-05-24 | Payer: MEDICARE

## 2022-05-24 MED ORDER — METOPROLOL SUCCINATE 100 MG PO TB24
ORAL_TABLET | 3 refills
Start: 2022-05-24 — End: ?

## 2022-07-16 ENCOUNTER — Encounter: Admit: 2022-07-16 | Discharge: 2022-07-16 | Payer: MEDICARE

## 2022-08-19 ENCOUNTER — Encounter: Admit: 2022-08-19 | Discharge: 2022-08-19 | Payer: MEDICARE

## 2022-08-19 MED ORDER — ELIQUIS 5 MG PO TAB
ORAL_TABLET | 0 refills | Status: AC
Start: 2022-08-19 — End: ?

## 2022-10-13 ENCOUNTER — Encounter: Admit: 2022-10-13 | Discharge: 2022-10-13 | Payer: MEDICARE

## 2022-10-13 DIAGNOSIS — Z4502 Encounter for adjustment and management of automatic implantable cardiac defibrillator: Secondary | ICD-10-CM

## 2022-10-15 ENCOUNTER — Encounter: Admit: 2022-10-15 | Discharge: 2022-10-15 | Payer: MEDICARE

## 2022-11-11 ENCOUNTER — Encounter: Admit: 2022-11-11 | Discharge: 2022-11-11 | Payer: MEDICARE

## 2022-11-11 DIAGNOSIS — Z4502 Encounter for adjustment and management of automatic implantable cardiac defibrillator: Secondary | ICD-10-CM

## 2022-11-11 NOTE — Telephone Encounter
-----   Message from Gwynneth Albright sent at 11/11/2022  2:50 PM CDT -----  Regarding: PPM ck  Good afternoon! Can someone please put in a new order for PPM ck. Could not get patient scheduled until 05/04/23.    Thank you.

## 2022-11-14 ENCOUNTER — Encounter: Admit: 2022-11-14 | Discharge: 2022-11-14 | Payer: MEDICARE

## 2022-11-14 DIAGNOSIS — E785 Hyperlipidemia, unspecified: Secondary | ICD-10-CM

## 2022-11-14 DIAGNOSIS — I428 Other cardiomyopathies: Secondary | ICD-10-CM

## 2022-11-14 DIAGNOSIS — I480 Paroxysmal atrial fibrillation: Secondary | ICD-10-CM

## 2022-11-14 MED ORDER — ELIQUIS 5 MG PO TAB
ORAL_TABLET | 0 refills | Status: AC
Start: 2022-11-14 — End: ?

## 2022-12-10 ENCOUNTER — Encounter: Admit: 2022-12-10 | Discharge: 2022-12-10 | Payer: MEDICARE

## 2023-01-24 ENCOUNTER — Encounter: Admit: 2023-01-24 | Discharge: 2023-01-24 | Payer: MEDICARE

## 2023-01-24 MED ORDER — ATORVASTATIN 20 MG PO TAB
20 mg | ORAL_TABLET | Freq: Every day | ORAL | 0 refills | Status: AC
Start: 2023-01-24 — End: ?

## 2023-01-24 MED ORDER — SOTALOL 120 MG PO TAB
120 mg | ORAL_TABLET | Freq: Two times a day (BID) | ORAL | 0 refills | 30.00000 days | Status: AC
Start: 2023-01-24 — End: ?

## 2023-02-26 ENCOUNTER — Encounter: Admit: 2023-02-26 | Discharge: 2023-02-26 | Payer: MEDICARE

## 2023-02-27 ENCOUNTER — Encounter: Admit: 2023-02-27 | Discharge: 2023-02-27 | Payer: MEDICARE

## 2023-03-03 ENCOUNTER — Encounter: Admit: 2023-03-03 | Discharge: 2023-03-03 | Payer: MEDICARE

## 2023-03-03 NOTE — Progress Notes
Request for the following medical records for purpose of continuity of care:    Please fax results of annual labs for Dr. Bradly Bienenstock review of medication management .     Please Fax to: [phone]  Cardiology Services at [facility]   Dr. Bradly Bienenstock  Attention:  Alcario Drought, RN    Thank you

## 2023-03-28 ENCOUNTER — Encounter: Admit: 2023-03-28 | Discharge: 2023-03-28 | Payer: MEDICARE

## 2023-04-13 ENCOUNTER — Ambulatory Visit: Admit: 2023-04-13 | Discharge: 2023-04-14 | Payer: MEDICARE

## 2023-04-18 ENCOUNTER — Encounter: Admit: 2023-04-18 | Discharge: 2023-04-18 | Payer: MEDICARE

## 2023-04-27 ENCOUNTER — Encounter: Admit: 2023-04-27 | Discharge: 2023-04-27 | Payer: MEDICARE

## 2023-05-04 ENCOUNTER — Ambulatory Visit: Admit: 2023-05-04 | Discharge: 2023-05-04 | Payer: MEDICARE

## 2023-05-04 ENCOUNTER — Ambulatory Visit: Admit: 2023-05-04 | Discharge: 2023-05-05 | Payer: MEDICARE

## 2023-05-04 ENCOUNTER — Encounter: Admit: 2023-05-04 | Discharge: 2023-05-04 | Payer: MEDICARE

## 2023-05-04 DIAGNOSIS — I428 Other cardiomyopathies: Secondary | ICD-10-CM

## 2023-05-04 DIAGNOSIS — Z7901 Long term (current) use of anticoagulants: Secondary | ICD-10-CM

## 2023-05-04 DIAGNOSIS — Z95810 Presence of automatic (implantable) cardiac defibrillator: Secondary | ICD-10-CM

## 2023-05-04 DIAGNOSIS — E785 Hyperlipidemia, unspecified: Secondary | ICD-10-CM

## 2023-05-04 DIAGNOSIS — I341 Nonrheumatic mitral (valve) prolapse: Secondary | ICD-10-CM

## 2023-05-04 DIAGNOSIS — I480 Paroxysmal atrial fibrillation: Secondary | ICD-10-CM

## 2023-05-04 DIAGNOSIS — Z136 Encounter for screening for cardiovascular disorders: Secondary | ICD-10-CM

## 2023-05-04 DIAGNOSIS — Z9889 Other specified postprocedural states: Secondary | ICD-10-CM

## 2023-05-04 DIAGNOSIS — I4901 Ventricular fibrillation: Secondary | ICD-10-CM

## 2023-05-04 DIAGNOSIS — Z8674 Personal history of sudden cardiac arrest: Secondary | ICD-10-CM

## 2023-05-04 DIAGNOSIS — I340 Nonrheumatic mitral (valve) insufficiency: Secondary | ICD-10-CM

## 2023-05-04 MED ORDER — APIXABAN 5 MG PO TAB
5 mg | ORAL_TABLET | Freq: Two times a day (BID) | ORAL | 0 refills | Status: AC
Start: 2023-05-04 — End: ?

## 2023-05-04 MED ORDER — SOTALOL 120 MG PO TAB
120 mg | ORAL_TABLET | Freq: Two times a day (BID) | ORAL | 0 refills | 30.00000 days | Status: AC
Start: 2023-05-04 — End: ?

## 2023-05-04 MED ORDER — ATORVASTATIN 20 MG PO TAB
20 mg | ORAL_TABLET | Freq: Every day | ORAL | 0 refills | Status: AC
Start: 2023-05-04 — End: ?

## 2023-05-04 MED ORDER — METOPROLOL SUCCINATE 100 MG PO TB24
100 mg | ORAL_TABLET | Freq: Every day | ORAL | 3 refills | 90.00000 days | Status: AC
Start: 2023-05-04 — End: ?

## 2023-05-04 NOTE — Progress Notes
 Date of Service: 05/04/2023    Tammy Heath is a 78 y.o. female.       HPI  Tammy Heath was seen in the office today in electrophysiology follow up. As you may know, she is a 78 y.o. 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  She does have dual-chamber ICD in place.  She does have a history of atrial fibrillation and has been managed on metoprolol Eliquis and sotalol therapy.  She follows with Dr. Marya Amsler and last saw her 1 year ago.  She presents today for routine electrophysiology follow-up.    Sister Tammy Heath reports doing fairly well since her last visit.  She reports only 1 episode of palpitations that woke her from sleep that were brief in nature.  She has noticed a new tremor and plans to follow-up with her primary care provider regarding this.  She denies exertional dyspnea or angina.  She denies signs or symptoms of bleeding.      Vitals:    05/04/23 1347   BP: 139/80   BP Source: Arm, Left Upper   Pulse: 60   SpO2: 99%   PainSc: Zero   Weight: 70.6 kg (155 lb 9.6 oz)   Height: 165.1 cm (5' 5)     Body mass index is 25.89 kg/m?Marland Kitchen     Past Medical History  Patient Active Problem List    Diagnosis Date Noted    SOB (shortness of breath) 12/01/2019    Chronic anticoagulationon apixaban 08/30/2018    ICD (implantable cardioverter-defibrillator) discharge 08/29/2018    Hyperlipidemia 08/29/2010    History of cardiac arrest      9/07  VF arrest with sudden cardiac death   -Dual chamber ICD implanted ([place], [place]), [employer]   -Cardiac cath:  Normal Cor's, EF 55%, MVP  6/10   VF arrest s/p successful ICD shocks, found to have severe MR with MVP      PAF (paroxysmal atrial fibrillation) (CMS-HCC)      7/10:  Post op atrial fibrillation   -Started on amiodarone        S/P MVR (mitral valve repair) 09/01/2008     08/17/08 ([facility]):  Right video-assisted thoracoscopy and robotic-assisted repair of mitral valve with triangular resection of P2 and implantation of 27-mm posterior annuloplasty band.  Repair of right atrial tear.  07/25/2015 - ECHO:        SVT (supraventricular tachycardia) 08/22/2008    Long QT syndrome 08/22/2008    Nonischemic cardiomyopathy (CMS-HCC) 08/04/2008     7/11 echo:  Normal chamber sizes other than mild right atrial enlargement   Normal ventricular systolic function   EF = 55%   Normal cardiac valve structure and function other than mild TV regurgitation   Normal pulmonary artery pressure.   PAP =   No significant pericardial effusion     7/13 stress echo:  Normal LV systolic function   Mild LV diastolic dysfunction   Non-ischemic response to exercise   Normal post-mitral valvuloplasty appearance   Bi-atrial enlargement   Normal PA pressure    08/30/2018 - ECHO:  The left ventricular systolic function is normal, the visually estimated ejection fraction is 55%.  Grade I (mild) left ventricular diastolic dysfunction.  The right ventricle is mildly dilated with mildly reduced systolic function, ICD lead is present.  Mild biatrial dilatation.  There is severe mitral annular calcification without stenosis.  Trace mitral valve regurgitation, mild to moderate tricuspid valve regurgitation.  Estimated Peak Systolic PA Pressure 28 mmHg  16/11/9602 - ECHO:  No regional wall motion abnormalities are seen. Overall left ventricular systolic function appears normal. The estimated left ventricular ejection fraction is 55-60%. Left ventricular contractility appears similar when compared with the prior echocardiogram performed on 08/30/2018.   Normal left ventricular diastolic function.   Right ventricular chamber dimensions and contractility appear normal.  Mild right atrial enlargement.  Prior mitral valve repair is noted.  Mild mitral valve regurgitation without mitral valve stenosis is noted by Doppler exam.  Moderate to severe tricuspid valve regurgitation is noted by Doppler exam.  Aortic valve sclerosis without stenosis.  No pericardial effusion is seen.  The estimated peak systolic PA pressure = 30 mmHg.  01/16/2021 - [facility] MPI:  This study is probably normal.  There is reduced tracer activity noted in the mid to distal anterolateral wall.  This is seen in the setting of breast attenuation artifact.  There is no corresponding wall motion or thickening abnormalities in this area.  Otherwise there are no definite fixed or reversible perfusion abnormalities present.  Global left ventricular systolic function is normal.  High risk adverse prognostic features are absent.  There are no prior studies available for direct comparison.  In aggregate the current study is low risk in regards to predicted annual cardiovascular mortality rate.      Mitral regurgitation 08/04/2008     08/10/08 cardiac cath [facility]):  No significant angiographic coronary artery stenosis.  Preserved LV systolic function with EF 55 percent, though in the presence of severe mitral regurgitation. The EF is not hyperdynamic, thus likely represents some systolic dysfunction.  Moderate pulmonary artery hypertension.  Diminished cardiac output at 3 liters per minute by thermodilution and 3.4 liters per minute by Fick.  Severe grade 4 mitral regurgitation with severe mitral valve prolapse is noted.  08/17/08 ([facility])  Right video-assisted thoracoscopy and robotic-assisted repair of mitral valve with triangular resection of P2 and implantation of 27-mm posterior annuloplasty band. Repair of right atrial tear  09/03/08  Readmitted for pericardial effusion and tamponade (INR 10):  S/p drain        Mitral valve prolapse 08/04/2008     7/09 echo:  Left Ventricular Ejection Fraction: 55%  Normal Left Ventricular Wall Thickness  No Regional Wall Motion Abnormalities  Right ventricular systolic function appears normal  Normal Chamber Dimensions  Mitral Valve Prolapse, PASP 26 mm Hg, mild-moderate MR  Valve Structures Otherwise Unremarkable  No Effusions, Masses or Thrombi  Pacemaker Electrode In Right Atrium And Right Ventricle  Normal Inferior Vena Cava Consistent With Normal Central Venous Pressure            Automatic implantable cardioverter-defibrillator in situ 08/04/2008     7/10 cardiac cath:  FINAL IMPRESSION:   No significant angiographic coronary artery stenosis.  Preserved LV systolic function with EF 55 percent, though in the presence of severe mitral regurgitation. The EF is not hyperdynamic, thus likely represents some systolic dysfunction.  Moderate pulmonary artery hypertension.  Diminished cardiac output at 3 liters per minute by thermodilution and 3.4 liters per minute by Fick.  Severe grade 4 mitral regurgitation with severe mitral valve prolapse is noted.    7/13 stress echo:  Normal LV systolic function   Mild LV diastolic dysfunction   Non-ischemic response to exercise   Normal post-mitral valvuloplasty appearance   Bi-atrial enlargement   Normal PA pressure    04/16/12 ICD  Generator Change w/RCP: [facility] INCEPTA ICD E163              Review of Systems   Constitutional: Negative.   HENT: Negative.     Eyes: Negative.    Cardiovascular: Negative.    Respiratory: Negative.     Endocrine: Negative.    Hematologic/Lymphatic: Negative.    Skin: Negative.    Musculoskeletal: Negative.    Gastrointestinal: Negative.    Genitourinary: Negative.    Neurological: Negative.    Psychiatric/Behavioral: Negative.     Allergic/Immunologic: Negative.    All other systems reviewed and are negative.      Physical Exam   Constitutional: She appears well-developed.   HENT:   Head: Normocephalic and atraumatic.   Eyes: Conjunctivae are normal.   Cardiovascular: Normal rate, regular rhythm and normal heart sounds.   Pulmonary/Chest: Effort normal and breath sounds normal.   Neurological: She is alert and oriented to person, place, and time. No sensory deficit.   Skin: Skin is warm and dry. Psychiatric: Her behavior is normal.       Cardiovascular Studies  ECG in office today shows a paced/V sensed rhythm at 60 bpm.  She does have a ICD with QRS duration near 86 ms.    Dual-chamber ICD interrogated in office today shows normal device function with a pacing 87% of the time.  RV pacing just 1% of the time.  AT/AF burden less than 1% no episodes of ventricular arrhythmias.  Battery life is stable with approximately 1 year remaining.    Cardiovascular Health Factors  Vitals BP Readings from Last 3 Encounters:   05/04/23 139/80   04/02/22 110/60   01/16/21 (!) 144/81     Wt Readings from Last 3 Encounters:   05/04/23 70.6 kg (155 lb 9.6 oz)   04/02/22 64.4 kg (142 lb)   01/16/21 68 kg (150 lb)     BMI Readings from Last 3 Encounters:   05/04/23 25.89 kg/m?   04/02/22 23.63 kg/m?   01/16/21 24.96 kg/m?      Smoking Social History     Tobacco Use   Smoking Status Never   Smokeless Tobacco Never      Lipid Profile Cholesterol   Date Value Ref Range Status   11/12/2022 155  Final     HDL   Date Value Ref Range Status   11/12/2022 54  Final     LDL   Date Value Ref Range Status   11/12/2022 79  Final     Triglycerides   Date Value Ref Range Status   11/12/2022 111  Final      Blood Sugar Hemoglobin A1C   Date Value Ref Range Status   08/08/2008 5.6 4.0 - 6.0 % Final     Comment:     NOTE NEW REFERENCE RANGES  For patients with Type I or Type II Diabetes Mellitus, the ADA recommends   maintaining the A1c level <7%.     Glucose   Date Value Ref Range Status   11/12/2022 96  Final   11/07/2021 95  Final   10/12/2020 99  Final     Glucose, POC   Date Value Ref Range Status   08/24/2008 103 (H) 70 - 100 MG/DL Final   70/62/3762 831 (H) 70 - 100 MG/DL Final   51/76/1607 371 (H) 70 - 100 MG/DL Final          Problems Addressed Today  Encounter Diagnoses   Name Primary?  Screening for heart disease Yes    PAF (paroxysmal atrial fibrillation) (CMS-HCC)     Nonischemic cardiomyopathy (CMS-HCC)     Ventricular fibrillation (CMS-HCC)     Hyperlipidemia, unspecified hyperlipidemia type     Nonrheumatic mitral valve regurgitation     Mitral valve prolapse     S/P MVR (mitral valve repair)     Automatic implantable cardioverter-defibrillator in situ     Chronic anticoagulationon apixaban     History of cardiac arrest        Assessment and Plan  Sister Milus Height appears to be doing well from an electrophysiology standpoint.  She is maintaining sinus rhythm with AT/AF burden less than 1%.mer dual-chamber defibrillator is functioning appropriately.  She has approximately 1 year of battery life remaining.  We will continue to monitor her device remotely and plan to see her back in 1 years time barring any changes in the interim.  She verbalized good understanding and agreement with this plan.    Jene Every, NP-C       Current Medications (including today's revisions)   acetaminophen (TYLENOL) 325 mg tablet Take two tablets by mouth every 4 hours as needed for Pain.    alendronate (FOSAMAX) 70 mg tablet Take one tablet by mouth every 7 days. Take at least 30 minutes before breakfast with plain water. Do not lie down for 30 minutes.    apixaban (ELIQUIS) 5 mg tablet TAKE ONE TABLET BY MOUTH TWICE DAILY. NEED LABS FOR FURTHER REFILLS.    aspirin EC 81 mg tablet Take one tablet by mouth daily.      atorvastatin (LIPITOR) 20 mg tablet TAKE ONE TABLET BY MOUTH DAILY. DUE FOR LABS FOR FURTHER REFILLS.    CALCIUM PO Take 600 mg by mouth twice daily.    cetirizine (ZYRTEC) 10 mg PO tablet Take one tablet by mouth daily.    cholecalciferol (vitamin D3) (OPTIMAL D3) 50,000 units capsule Take one capsule by mouth every 7 days.    metoprolol succinate XL (TOPROL XL) 100 mg extended release tablet TAKE 1 TABLET BY MOUTH EVERY DAY    other medication one Dose. Alkalete (acid reducer): Take 1 capsule daily as needed.    sotaloL (BETAPACE) 120 mg tablet TAKE ONE TABLET BY MOUTH EVERY 12 HOURS. DUE FOR LABS FOR FURTHER REFILLS

## 2023-07-10 ENCOUNTER — Encounter: Admit: 2023-07-10 | Discharge: 2023-07-09 | Payer: MEDICARE

## 2023-09-19 ENCOUNTER — Encounter: Admit: 2023-09-19 | Discharge: 2023-09-19 | Payer: MEDICARE

## 2023-09-26 ENCOUNTER — Encounter: Admit: 2023-09-26 | Discharge: 2023-09-26 | Payer: MEDICARE

## 2023-10-06 ENCOUNTER — Encounter: Admit: 2023-10-06 | Discharge: 2023-10-06 | Payer: MEDICARE

## 2023-10-06 DIAGNOSIS — Z4502 Encounter for adjustment and management of automatic implantable cardiac defibrillator: Principal | ICD-10-CM

## 2023-10-14 ENCOUNTER — Encounter: Admit: 2023-10-14 | Discharge: 2023-10-14 | Payer: MEDICARE

## 2023-10-14 MED ORDER — ATORVASTATIN 20 MG PO TAB
20 mg | ORAL_TABLET | Freq: Every day | ORAL | 0 refills | 90.00000 days | Status: AC
Start: 2023-10-14 — End: ?

## 2023-10-14 MED ORDER — SOTALOL 120 MG PO TAB
120 mg | ORAL_TABLET | Freq: Two times a day (BID) | ORAL | 0 refills | 30.00000 days | Status: AC
Start: 2023-10-14 — End: ?

## 2023-10-15 ENCOUNTER — Encounter: Admit: 2023-10-15 | Discharge: 2023-10-15 | Payer: MEDICARE

## 2023-10-15 MED ORDER — SOTALOL 120 MG PO TAB
120 mg | ORAL_TABLET | Freq: Two times a day (BID) | ORAL | 1 refills | 30.00000 days | Status: AC
Start: 2023-10-15 — End: ?

## 2023-10-20 ENCOUNTER — Encounter: Admit: 2023-10-20 | Discharge: 2023-10-20 | Payer: MEDICARE

## 2023-11-09 ENCOUNTER — Encounter: Admit: 2023-11-09 | Discharge: 2023-11-09 | Payer: MEDICARE

## 2023-11-09 NOTE — Telephone Encounter
 Hezekiah Shuck, LPN  P Cvm Nurse Ep Team A  RC from patient at 1:11pm.  Call her at #[phone].    LM for CB

## 2023-11-09 NOTE — Telephone Encounter
 Pt due for f/u with RCP in March 2026.     LVM with pt requesting CB.

## 2023-11-09 NOTE — Telephone Encounter
-----   Message from [place] B sent at 11/09/2023 12:42 PM CDT -----  Regarding: RCP- gen change  VM on triage line from patient at 11:15am.  Said that she saw on line that her ICD has 5 months left.  I tried to get appointment on line but cannot get in till after January.   Call her at #[phone].

## 2023-11-18 ENCOUNTER — Encounter: Admit: 2023-11-18 | Discharge: 2023-11-18 | Payer: MEDICARE

## 2023-12-09 ENCOUNTER — Encounter: Admit: 2023-12-09 | Discharge: 2023-12-09 | Payer: MEDICARE

## 2023-12-13 NOTE — Progress Notes [1]
 Movement Disorders Clinic  Department of Neurology  [facility]    Referred by:  Heath, Jaymi, NP  [address]  [place] [id number]       Reason for visit:  Tammy Heath is 78 y.o. right handed female who presents today for evaluation of tremor.  Medical history of NICM, SVT, long QT syndrome, pAfib, s/p ICD    On review of records prior to today's visit  Ref by NP Elayne for head tremor. Head will involuntary start shaking side to side in the morning and at night. 6 months of symptoms and worsened. No MRI due to ICD.    Initial Visit 12/14/2023 :  Accompanied by: friend who also aids in providing history.    She reports head tremor starting Spring 2025. No changes in health or medications. No major stressors. She first noticed it after the end of a work day and would put her head back on a recliner. She does not notice it when laying down. She tends to hold her head when sitting. She is not sure this changes the tremor. She used to think turning her head downwards would help, but this does not seem to help any more. She will think about it and notice it. Her friend mentions there are times her head does not shake at all. Her friend reports when she sings, her tremor stops. She does not notice it during the daytime when working. She reports some shaking in her trunk when her hands. She denies hand or leg tremor. She denies voice tremor with speech or with singing. No changes in balance, falls or stumbles. She reports some zigzags in vision but no migraines.    Current medications tried for this issue:   None    Related  Apixaban   Metoprolol   Sotalol     Past medications tried for this issue:   None       PD Review of Symptoms:   Onset: January 2025   Sense of smell: no   Dysarthria/Hypophonia: no   Dysphagia: no   Sialorrhea: -   Mood: no   Emotional lability: -  Neuroleptic exposure: no   Cognitive slowing: names   Impulsivity: -   Obsessions/Compulsions: -   Hallucinations: no   Urinary changes: n   Constipation: n   Orthostasis: n   Cold discolored hands/feet: -   Stridor/inspiratory sigh: -   Sweating/dry skin: -   Sexual dysfunction: -   Dyskinesia: n/a   Tremors: head   Falls: no   Freezing: -   Micrographia: no   Sleep issues:    -OSA: no    -RBD: no   Exercise: walking   Last skin exam: no   Driving/Safety: active, no issues   FH: older sister (?head tremor)   Alcohol: -   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 but similar to others my age.   Hallucinations/delusions: seeing, hearing, feeling or imagining things that are not there or not true: None   Depression: feeling sad, blue, hopeless, unable to enjoy things: None   Anxiety: nervous, worried, tearful or tense: None.   Apathy: loss of interest, enthusaism, or concern: None.   Gambling: None.   Shopping: None.   Sex: None.   Pornography: None.   Eating: None.   Doing unnecessary things  like emptying drawers/closets in bedroom/garage and leaving things in a mess: None.   Nighttime sleep: Marked. I have difficulty falling asleep, wake up multiple times, and have difficulty getting back to sleep.   Daytime sleepiness: None.   Vivid dreams: dreams that are so clear they seem real. None.   REM sleep behavior disorder: talking during or acting out your dreams. None or don?t know as I sleep alone.   Restless legs syndrome: uncomfortable sensations in your legs that are uncontrollable and occur when resting, in the evenings, or when you get in bed. Moderate. I have these sensations frequently in the evening or while in bed and they affect my sleeping.   Pain or muscle cramps: Slight. I have pain or cramps occasionally, but they are mild, and I do not take pain medication.   Urination: None.   Constipation: Slight. I am occasionally constipated, but I do not take any medications.   Dizziness or lightheadedness: None. Tiredness/Fatigue: Slightly. I can finish all my tasks/activities, but my stamina is reduced, and I feel tired more quickly than I used to.   Falling: None. I have not fallen.   Personal Care: None. I do not need any help, I take care of myself without any problems.   Assistive devices for getting around: None   Are you taking any form of levodopa? No           Medications:   Medications Ordered Prior to Encounter[1]    Past Medical History:    Past Medical History:    Abnormal involuntary movement    Atrial fibrillation (CMS-HCC)    Cardiac arrest (CMS-HCC)    Cardiac arrest - ventricular fibrillation    Cardiac dysrhythmia    Chronic anticoagulation    Dyslipidemia    Dyspnea    History of cardiac arrest    HTN (hypertension)    HX: anticoagulation    Mitral regurgitation    Mitral valve prolapse    PAF (paroxysmal atrial fibrillation) (CMS-HCC)    Postmenopausal    Valvular heart disease    Vision decreased        Social History:    Social History     Socioeconomic History    Marital status: Single   Occupational History    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 @ [employer]   Tobacco Use    Smoking status: Never    Smokeless tobacco: Never   Substance and Sexual Activity    Alcohol use: No    Drug use: Never    Sexual activity: Not Currently       Family History:   Family History   Problem Relation Name Age of Onset    Heart Failure Mother Age 47     Other Father          COPD    Other Sister          Cardiac valve repair, 3 other sister healthy    Other Brother          2 healthy brothers    Dementia Mother Age 40     Stroke Sister Tammy Heath         Major stroke in 2024 resulting in brain bleed anddeath    Stroke Sister Tammy Heath         Stroke April 2025; dec Oct 2025        Allergies:   Allergies[2]      PHYSICAL EXAMINATION:  VITAL SIGNS:   Vitals:    12/14/23 0833 12/14/23 0837   BP: (!) 161/75 137/73   BP Source: Arm, Right Upper Arm, Right Upper   Patient Position: Sitting Standing   Pulse: 60 60         NEUROLOGICAL EXAM:    Mental Status: Alert and oriented x 3, fluent speech, comprehension intact, normal attention, adequate fund of knowledge, remote and short term memory intact    Soft volume of voice  No hypomimia    Cranial Nerves:  I:  Not tested  II:  PERRL, VFF to counting fingers  III, IV, VI: EOM intact with conjugate gaze and no nystagmus on end gaze, saccadic smooth pursuit, saccades normal in vertical and horizontal directions  V: Facial sensation intact and symmetric over the bilateral V1-V3  VII: Facial muscle activation intact and symmetric over the bilateral upper and lower face, blinking Normal  VIII: Hearing intact in the b/l ears and symmetrical to finger rub  IX, X, XII:  TUP midline, able to move tongue from left to right  XI: Shoulder shrug full strength b/l and symmetric    Motor:  Bulk: Normal bulk throughout  Tone: UPDRS  Fasciculations/Atrophy: no  5/5 strength in all 4 ext, both distally and proximally   Bradykinesia: UPDRS  Dystonia:  left torticollis, right laterocollis, no-no head tremor, mild hypertrophy of trap b/l  Dyskinesia: n.  Myoclonus: n.  Chorea: n.  Tics: n.    DTRs:   Biceps Triceps Brachioradialis Knee Ankle Toes   Right 2 1 1 2 1  -   Left 2 1 1 2 1  -   -Hoffman's reflex: n    Sensation:  Intact to light touch  in all four extremities    Coordination:  Finger-to-nose-finger: mild dysmetria BUE  Finger follow finger: no dysmetria  Heel to shin: mild dysmetria RLE    Tremor: Kinetic tremor intention LUE only, no-no head tremor    Rigidity:    See UPDRS below    Gait:  This included normal raising from a chair, normal stance. Gait showed Normal stride length and Normal stance width, normal stepping, good and stable turns, Reduced left arm swing, No re-emergent upper extremity tremor. Mild difficulty with tandem. Able to stand tandem without losing balance.    MDS-UPDRS 12/14/23    Is the patient on medication for treating the symptoms of Parkinson's Disease?   n   If so, is the patient ON or OFF? n   Is the patient on levodopa? n   If yes, minutes since last levodopa dose:   n   Speech 0   Facial Expression 0   Rigidity   Neck  RUE  LUE  RLE  LLE   2  2  2   0  2   Finger Tapping  Right  Left   1  1   Hand Movements  Right  Left   1  0   Pronation-Supination  Right  Left   1  1   Toe Tapping  Right  Left   0  0   Leg Agility  Right  Left   0  0   Arising from a Chair 0   Gait 1   Freezing of Gait 0   Postural Instability 0   Posture 2   Global Spontaneity of Movement (Body Bradykinesia)   1   Postural Tremor of the Hands  Right  Left   0  0   Kinetic  Tremor of the Hands  Right  Left   0  1   Rest Tremor Amplitude   RUE  LUE  RLE  LLE  Lip/Jaw   0  0  0  0  0   Constancy of Rest Tremor 0   Dyskinesia Impact  Were dyskinesias present?  If yes, did they interfere?   no  N/a   Total 18           12/09/2023   Tremor Rating Assess Scale   Speaking 1   Feeding with a spoon 0   Drinking from a glass 0   Hygiene 0   Dressing 0   Pouring 0   Carrying 0   Using Keys 0   Writing 0   Working 0   Task No disability with any task   Overall disability with task 0   Social Impact 0   ADL Score 1   Head tremor 2   Face tremor 0   Voice Tremor 2   R Upper Forward Outstretch Tremor 0   L Upper Forward Outstretch Tremor 0   R Upper Lateral Wing BeatingTremor 0   L Upper Lateral Wing BeatingTremor 0   R Upper Kinetic Tremor 0   L Upper Kinetic Tremor 1   R Lower Limb Tremor 0   L Lower Limb Tremor 0           12/09/2023   [employer] Activities of Daily Living (BF-ADL)   Cut food with a knife and fork Able to do activity without difficulty   Use a spoon to drink soup Able to do activity without difficulty   Hold a cup of tea Able to do activity without difficulty   Pour milk from a bottle or carton Able to do activity without difficulty   Wash and dry dishes Able to do activity without difficulty   Brush your teeth Able to do activity without difficulty Use a handkerchief to blow your nose Able to do activity without difficulty   Use a bath Able to do activity without difficulty   Use the lavatory Able to do activity without difficulty   Wash your face and hands Able to do activity without difficulty   Tie up your shoelaces Able to do activity without difficulty   Do up buttons Able to do activity without difficulty   Do up a zip Able to do activity without difficulty   Write a letter Able to do activity without difficulty   Put a letter in an envelope Able to do activity without difficulty   Hold and read a newspaper Able to do activity without difficulty   Dial a telephone Able to do activity without difficulty   Make yourself understood on the telephone Able to do activity without difficulty   Watch a television Able to do activity without difficulty   Pick up your change in a shop Able to do activity without difficulty   Insert an electrical plug into a socket Able to do activity without difficulty   Unlock your front door with the key Able to do activity without difficulty   Walk up and down stairs Able to do activity without difficulty   Get out of an armchair Able to do activity without difficulty   Carry a full shopping bag Able to do activity without difficulty   Total Score 25        Patient-reported      Labs/Imaging:    MRI brain  no    DaT Scan no    All relevant labs reviewed.    1. Cervical dystonia (Primary)    2. Ataxia    3. Parkinsonism, unspecified Parkinsonism type (CMS-HCC)       Assessment     Laverne Hursey is a 78 y.o. female who presents today for evaluation of tremor. She has head tremor starting Spring 2025. 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 Though the constellation of parkinsonism, ataxia, and cervical dystonia could also suggest and SCA such as SCA 2,3, or 17. We discussed treatment of dystonia with Botulinum Toxin Injections, Baclofen, and Carbidopa/Levodopa. Previously failed none.     She is not currently bothered enough by her head tremor to pursue treatment.     Potential plan for Botox.  left torticollis, right laterocollis, no-no head tremor, mild hypertrophy of trap b/l  Muscle R units L units   SCM 15 10   Splenius capitus 10 15   Semispinalis capitus - -   Levator scapulae 15 -   Trapezius 5x4 5x4    Total 105 units        Plan     -Consider Baclofen for dystonia/muscle spasm  -Consider prior authorization for botulinum toxin injections. Botox (OnabotulinumtoxinA) 200u [id number] units for cervical dystonia  -Monitor ataxia and parkinsonism. Consider SCA testing pending clinical course.      To return to clinic 6 months      Total time spent 45 mins, including face-to-face with patient, chart review, documentation and coordination of care. >50% time spent in discussing diagnosis, plan of care and counseling, and answering patient and family questions.    Prentice PARAS. Angie Hogg, MD  [facility]  [facility]         [1]   Current Outpatient Medications on File Prior to Visit   Medication Sig Dispense Refill    acetaminophen  (TYLENOL ) 325 mg tablet Take two tablets by mouth every 4 hours as needed for Pain.      alendronate (FOSAMAX) 70 mg tablet Take one tablet by mouth every 7 days. Take at least 30 minutes before breakfast with plain water. Do not lie down for 30 minutes.      aspirin  EC 81 mg tablet Take one tablet by mouth daily.        atorvastatin  (LIPITOR) 20 mg tablet TAKE ONE TABLET BY MOUTH DAILY. DUE FOR LABS FOR FURTHER REFILLS. 90 tablet 0    CALCIUM PO Take 600 mg by mouth twice daily.      cetirizine  (ZYRTEC ) 10 mg PO tablet Take one tablet by mouth daily.      cholecalciferol (vitamin D3) (OPTIMAL D3) 50,000 units capsule Take one capsule by mouth every 7 days.      ELIQUIS  5 mg tablet TAKE 1 TABLET BY MOUTH TWICE A DAY 180 tablet 0    metoprolol  succinate XL (TOPROL  XL) 100 mg extended release tablet Take one tablet by mouth daily. 90 tablet 3    other medication one Dose. Alkalete (acid reducer): Take 1 capsule daily as needed.      sotaloL  (BETAPACE ) 120 mg tablet TAKE ONE TABLET BY MOUTH EVERY 12 HOURS. DUE FOR LABS FOR FURTHER REFILLS 90 tablet 1     No current facility-administered medications on file prior to visit.   [2]   Allergies  Allergen Reactions    Adhesive Tape-Silicones RASH  Use paper tape

## 2023-12-14 ENCOUNTER — Encounter: Admit: 2023-12-14 | Discharge: 2023-12-14 | Payer: MEDICARE

## 2023-12-14 ENCOUNTER — Ambulatory Visit: Admit: 2023-12-14 | Discharge: 2023-12-15 | Payer: MEDICARE

## 2023-12-14 VITALS — BP 161/75 | HR 60 | Ht 65.0 in | Wt 159.0 lb

## 2023-12-14 DIAGNOSIS — G20C Parkinsonism, unspecified Parkinsonism type (CMS-HCC): Secondary | ICD-10-CM

## 2023-12-14 DIAGNOSIS — R270 Ataxia, unspecified: Secondary | ICD-10-CM

## 2023-12-14 DIAGNOSIS — G243 Spasmodic torticollis: Principal | ICD-10-CM

## 2023-12-14 NOTE — Telephone Encounter [36]
 Her device  reached ERI  She is wondering if she needs to come in to have it stop beeping  Clinic RN is sending her the information  Device for battery depletion will 16 beeps repeated every 6 hours for battery depletion for 4 times and than will stop

## 2023-12-17 ENCOUNTER — Encounter: Admit: 2023-12-17 | Discharge: 2023-12-17 | Payer: MEDICARE

## 2023-12-21 ENCOUNTER — Encounter: Admit: 2023-12-21 | Discharge: 2023-12-21 | Payer: MEDICARE

## 2023-12-21 DIAGNOSIS — Z4502 Encounter for adjustment and management of automatic implantable cardiac defibrillator: Principal | ICD-10-CM

## 2023-12-21 DIAGNOSIS — I4581 Long QT syndrome: Secondary | ICD-10-CM

## 2023-12-29 ENCOUNTER — Encounter: Admit: 2023-12-29 | Discharge: 2023-12-29 | Payer: MEDICARE

## 2024-01-05 ENCOUNTER — Encounter: Admit: 2024-01-05 | Discharge: 2024-01-05 | Payer: MEDICARE

## 2024-01-05 DIAGNOSIS — E785 Hyperlipidemia, unspecified: Principal | ICD-10-CM

## 2024-01-05 MED ORDER — ATORVASTATIN 20 MG PO TAB
20 mg | ORAL_TABLET | Freq: Every day | ORAL | 0 refills | 90.00000 days | Status: AC
Start: 2024-01-05 — End: ?

## 2024-01-06 ENCOUNTER — Encounter: Admit: 2024-01-06 | Discharge: 2024-01-06 | Payer: MEDICARE

## 2024-01-06 NOTE — Progress Notes [1]
 Medicare is listed as patient's primary insurance coverage.  Pre-certification is not required for hospitalizations.

## 2024-01-11 ENCOUNTER — Encounter: Admit: 2024-01-11 | Discharge: 2024-01-11 | Payer: MEDICARE

## 2024-01-12 ENCOUNTER — Encounter: Admit: 2024-01-12 | Discharge: 2024-01-12 | Payer: MEDICARE

## 2024-01-12 DIAGNOSIS — I4581 Long QT syndrome: Principal | ICD-10-CM

## 2024-01-12 DIAGNOSIS — E785 Hyperlipidemia, unspecified: Secondary | ICD-10-CM

## 2024-01-12 LAB — LIPID PROFILE
CHOLESTEROL/HDL %: 3
CHOLESTEROL: 141
HDL: 50
LDL: 66
TRIGLYCERIDES: 127
VLDL: 25

## 2024-01-12 LAB — MAGNESIUM: MAGNESIUM: 2.2

## 2024-01-12 LAB — BASIC METABOLIC PANEL
ANION GAP: 123
BLD UREA NITROGEN: 20 — ABNORMAL HIGH
CALCIUM: 9.3
CHLORIDE: 106
CO2: 26
CREATININE: 0.9
GFR ESTIMATED: 59
GLUCOSE,PANEL: 96
POTASSIUM: 4.1
SODIUM: 144

## 2024-01-13 ENCOUNTER — Encounter: Admit: 2024-01-13 | Discharge: 2024-01-13 | Payer: MEDICARE

## 2024-01-13 ENCOUNTER — Ambulatory Visit: Admit: 2024-01-13 | Discharge: 2024-01-13 | Payer: MEDICARE

## 2024-01-13 ENCOUNTER — Ambulatory Visit: Admit: 2024-01-13 | Discharge: 2024-01-14 | Payer: MEDICARE

## 2024-01-13 VITALS — BP 124/76 | HR 60 | Ht 65.0 in | Wt 158.2 lb

## 2024-01-13 DIAGNOSIS — I471 SVT (supraventricular tachycardia): Secondary | ICD-10-CM

## 2024-01-13 DIAGNOSIS — I4901 Ventricular fibrillation: Principal | ICD-10-CM

## 2024-01-13 DIAGNOSIS — I480 Paroxysmal atrial fibrillation: Secondary | ICD-10-CM

## 2024-01-13 DIAGNOSIS — R0989 Other specified symptoms and signs involving the circulatory and respiratory systems: Secondary | ICD-10-CM

## 2024-01-13 MED ORDER — SOTALOL 120 MG PO TAB
120 mg | ORAL_TABLET | Freq: Two times a day (BID) | ORAL | 0 refills | 30.00000 days | Status: AC
Start: 2024-01-13 — End: ?

## 2024-01-25 ENCOUNTER — Encounter: Admit: 2024-01-25 | Discharge: 2024-01-25 | Payer: MEDICARE

## 2024-01-26 ENCOUNTER — Encounter: Admit: 2024-01-26 | Discharge: 2024-01-26 | Payer: MEDICARE

## 2024-01-27 ENCOUNTER — Encounter: Admit: 2024-01-27 | Discharge: 2024-01-27 | Payer: MEDICARE

## 2024-01-27 MED ORDER — ATORVASTATIN 20 MG PO TAB
ORAL_TABLET | ORAL | 1 refills | 90.00000 days | Status: AC
Start: 2024-01-27 — End: ?

## 2024-01-29 ENCOUNTER — Encounter: Admit: 2024-01-29 | Discharge: 2024-01-29 | Payer: MEDICARE

## 2024-01-29 NOTE — Telephone Encounter [36]
-----   Message from JONELLE Millin, MD sent at 01/29/2024  4:57 PM CST -----  Can we get these lady to come to office for device check?  Not urgent, sometime in the next month or so.  Her RV threshold by remote quite a bit higher than in procedure

## 2024-01-29 NOTE — Telephone Encounter [36]
 LM for C/B.

## 2024-02-01 ENCOUNTER — Encounter: Admit: 2024-02-01 | Discharge: 2024-02-01 | Payer: MEDICARE

## 2024-02-03 ENCOUNTER — Encounter: Admit: 2024-02-03 | Discharge: 2024-02-03 | Payer: MEDICARE

## 2024-02-19 ENCOUNTER — Encounter: Admit: 2024-02-19 | Discharge: 2024-02-19 | Payer: MEDICARE

## 2024-02-22 ENCOUNTER — Encounter: Admit: 2024-02-22 | Discharge: 2024-02-22 | Payer: MEDICARE

## 2024-02-22 ENCOUNTER — Ambulatory Visit: Admit: 2024-02-22 | Discharge: 2024-02-22 | Payer: MEDICARE

## 2024-03-12 ENCOUNTER — Encounter: Admit: 2024-03-12 | Discharge: 2024-03-12 | Payer: MEDICARE
